# Patient Record
Sex: Male | Born: 1945 | Race: White | Hispanic: No | Smoking: Former smoker
Health system: Southern US, Community
[De-identification: ages and names within clinical notes are randomized; demographics above are authoritative.]

## PROBLEM LIST (undated history)

## (undated) DIAGNOSIS — I429 Cardiomyopathy, unspecified: Secondary | ICD-10-CM

## (undated) DIAGNOSIS — F1411 Cocaine abuse, in remission: Secondary | ICD-10-CM

## (undated) DIAGNOSIS — D649 Anemia, unspecified: Secondary | ICD-10-CM

## (undated) DIAGNOSIS — I509 Heart failure, unspecified: Secondary | ICD-10-CM

## (undated) DIAGNOSIS — E785 Hyperlipidemia, unspecified: Secondary | ICD-10-CM

## (undated) DIAGNOSIS — R0602 Shortness of breath: Secondary | ICD-10-CM

## (undated) DIAGNOSIS — F1011 Alcohol abuse, in remission: Secondary | ICD-10-CM

## (undated) DIAGNOSIS — M1990 Unspecified osteoarthritis, unspecified site: Secondary | ICD-10-CM

## (undated) DIAGNOSIS — I480 Paroxysmal atrial fibrillation: Secondary | ICD-10-CM

## (undated) DIAGNOSIS — I10 Essential (primary) hypertension: Secondary | ICD-10-CM

## (undated) HISTORY — PX: WISDOM TOOTH EXTRACTION: SHX21

## (undated) HISTORY — DX: Hyperlipidemia, unspecified: E78.5

## (undated) HISTORY — DX: Anemia, unspecified: D64.9

## (undated) HISTORY — PX: MASTOIDECTOMY: SHX711

## (undated) HISTORY — PX: UPPER GASTROINTESTINAL ENDOSCOPY: SHX188

## (undated) HISTORY — DX: Paroxysmal atrial fibrillation: I48.0

## (undated) HISTORY — DX: Cardiomyopathy, unspecified: I42.9

## (undated) HISTORY — PX: COLONOSCOPY: SHX174

## (undated) HISTORY — DX: Cocaine abuse, in remission: F14.11

## (undated) HISTORY — PX: HEMORROIDECTOMY: SUR656

## (undated) HISTORY — DX: Alcohol abuse, in remission: F10.11

---

## 2000-10-28 ENCOUNTER — Other Ambulatory Visit: Admission: RE | Admit: 2000-10-28 | Discharge: 2000-10-28 | Payer: Self-pay | Admitting: General Surgery

## 2001-03-03 ENCOUNTER — Other Ambulatory Visit: Admission: RE | Admit: 2001-03-03 | Discharge: 2001-03-03 | Payer: Self-pay | Admitting: General Surgery

## 2001-03-30 ENCOUNTER — Other Ambulatory Visit: Admission: RE | Admit: 2001-03-30 | Discharge: 2001-03-30 | Payer: Self-pay | Admitting: General Surgery

## 2001-04-14 ENCOUNTER — Other Ambulatory Visit: Admission: RE | Admit: 2001-04-14 | Discharge: 2001-04-14 | Payer: Self-pay | Admitting: General Surgery

## 2001-07-22 ENCOUNTER — Observation Stay (HOSPITAL_COMMUNITY): Admission: RE | Admit: 2001-07-22 | Discharge: 2001-07-23 | Payer: Self-pay | Admitting: General Surgery

## 2002-12-31 ENCOUNTER — Ambulatory Visit (HOSPITAL_COMMUNITY): Admission: RE | Admit: 2002-12-31 | Discharge: 2002-12-31 | Payer: Self-pay | Admitting: General Surgery

## 2002-12-31 ENCOUNTER — Encounter: Payer: Self-pay | Admitting: General Surgery

## 2006-12-22 ENCOUNTER — Inpatient Hospital Stay (HOSPITAL_COMMUNITY): Admission: EM | Admit: 2006-12-22 | Discharge: 2006-12-26 | Payer: Self-pay | Admitting: Emergency Medicine

## 2006-12-24 ENCOUNTER — Encounter: Payer: Self-pay | Admitting: Cardiovascular Disease

## 2007-01-19 ENCOUNTER — Ambulatory Visit (HOSPITAL_COMMUNITY): Admission: RE | Admit: 2007-01-19 | Discharge: 2007-01-19 | Payer: Self-pay | Admitting: Pulmonary Disease

## 2007-01-22 ENCOUNTER — Encounter (HOSPITAL_COMMUNITY): Admission: RE | Admit: 2007-01-22 | Discharge: 2007-02-21 | Payer: Self-pay | Admitting: *Deleted

## 2007-02-23 ENCOUNTER — Encounter (HOSPITAL_COMMUNITY): Admission: RE | Admit: 2007-02-23 | Discharge: 2007-03-25 | Payer: Self-pay | Admitting: *Deleted

## 2007-03-26 ENCOUNTER — Encounter (HOSPITAL_COMMUNITY): Admission: RE | Admit: 2007-03-26 | Discharge: 2007-04-07 | Payer: Self-pay | Admitting: *Deleted

## 2007-04-10 ENCOUNTER — Encounter (HOSPITAL_COMMUNITY): Admission: RE | Admit: 2007-04-10 | Discharge: 2007-05-10 | Payer: Self-pay | Admitting: *Deleted

## 2007-05-11 ENCOUNTER — Encounter (HOSPITAL_COMMUNITY): Admission: RE | Admit: 2007-05-11 | Discharge: 2007-06-10 | Payer: Self-pay | Admitting: *Deleted

## 2007-06-09 ENCOUNTER — Encounter (HOSPITAL_COMMUNITY): Admission: RE | Admit: 2007-06-09 | Discharge: 2007-07-08 | Payer: Self-pay | Admitting: *Deleted

## 2009-09-19 HISTORY — PX: US ECHOCARDIOGRAPHY: HXRAD669

## 2010-03-08 HISTORY — PX: TEE WITH CARDIOVERSION: SHX5442

## 2010-03-21 ENCOUNTER — Ambulatory Visit (HOSPITAL_COMMUNITY): Admission: RE | Admit: 2010-03-21 | Discharge: 2010-03-21 | Payer: Self-pay | Admitting: Cardiology

## 2010-03-27 ENCOUNTER — Encounter (INDEPENDENT_AMBULATORY_CARE_PROVIDER_SITE_OTHER): Payer: Self-pay | Admitting: Cardiology

## 2010-03-27 ENCOUNTER — Ambulatory Visit (HOSPITAL_COMMUNITY): Admission: RE | Admit: 2010-03-27 | Discharge: 2010-03-27 | Payer: Self-pay | Admitting: Cardiology

## 2010-04-20 ENCOUNTER — Ambulatory Visit (HOSPITAL_COMMUNITY): Admission: RE | Admit: 2010-04-20 | Discharge: 2010-04-20 | Payer: Self-pay | Admitting: Cardiology

## 2010-07-29 ENCOUNTER — Encounter: Payer: Self-pay | Admitting: General Surgery

## 2010-11-20 NOTE — Discharge Summary (Signed)
NAME:  Ronald Owens, Ronald Owens NO.:  [id number]   MEDICAL RECORD NO.:  [id number]          PATIENT TYPE:  INP   LOCATION:  3704                         FACILITY:  [facility]   PHYSICIAN:  Dani Gobble, MD       DATE OF BIRTH:  1945/08/22   DATE OF ADMISSION:  12/23/2006  DATE OF DISCHARGE:  12/26/2006                               DISCHARGE SUMMARY   DISCHARGE DIAGNOSES:  1. Nonischemic cardiomyopathy by catheterization this admission.  2. Congestive heart failure, improved at discharge.  3. Paroxysmal atrial fibrillation with pauses, sinus rhythm at      discharge.   HOSPITAL COURSE:  Ronald Owens is a 66 year old male who was transferred  from [facility] for evaluation of new onset CHF.  He also had  atrial fibrillation and atrial flutter up there with pauses.  Prior to  admission he had been on no medications.  He has a history of  hypertension.  He has a prior history of drug and alcohol use, but none  in ten years.  He says he quit smoking two weeks ago.  The patient was  transferred and seen by Dr. Elsie Lincoln on admission.  By the time he got  here he had converted to sinus rhythm.  Diagnostic catheterization was  done on 12/24/2006 which revealed normal coronaries and dilated  cardiomyopathy with EF of 20 to 25%.  He has remained in sinus rhythm.  We feel he can be discharged on December 26, 2006.   DISCHARGE MEDICATIONS:  1. Lisinopril 20 mg daily.  2. Aldactone 12.5 mg daily.  3. Lanoxin 0.25 mg daily.  4. Coreg 12.5 mg b.i.d.  5. Two baby aspirin daily.   He will follow up with Dr. Domingo Sep in [place].   LABS AND X-RAYS:  Sodium 142, potassium 4.9, BUN 13, creatinine 1.12,  white count 7.7, hemoglobin 14.2, hematocrit 41.8, platelets 182.  Liver  functions are normal.  CKMB and troponins are negative.  LDL 114, HDL  41.  Blood cultures are negative.  Sputum culture shows gram-positive  cocci.  BNP is 180 at discharge.  Hemoglobin A1c 5.6.  D-dimer 0.95.  TSH  1.99.  Echocardiogram shows an EF of 35 to 40%.  CT angiogram of the  chest showed no evidence of pulmonary emboli and diffuse emphysema with  nonspecific right middle lobe nodule.  Follow-up CT is recommended in  six months.  INR is 1.0.  EKG shows sinus rhythm.   DISPOSITION:  The patient is discharged in stable condition.  He will  need to follow up with Dr. Domingo Sep.  We did cut back on his Aldactone  at discharge because of trend towards hyperkalemia.  Will go ahead and  give him a course of antibiotics at discharge for bronchitis.  He will  need a primary in [place].      Abelino Derrick, P.A.    ______________________________  Dani Gobble, MD    LKK/MEDQ  D:  12/26/2006  T:  12/26/2006  Job:  [id number]

## 2010-11-20 NOTE — Group Therapy Note (Signed)
NAME:  Ronald Owens, Ronald Owens NO.:  [id number]   MEDICAL RECORD NO.:  [id number]          PATIENT TYPE:  INP   LOCATION:  IC06                          FACILITY:  [facility]   PHYSICIAN:  Dorris Singh, DO    DATE OF BIRTH:  07-11-1945   DATE OF PROCEDURE:  DATE OF DISCHARGE:                                 PROGRESS NOTE   PROGRESS NOTE   This is patient's ICU day #1.  Patient apparently had a bad dream last  night and awoke in A-Fib with increased shortness or breath.  After, the  night doctor was called and evaluated him.  After several attempts to  get his heart rate and breathing under control, patient was then  transferred to the ICU for more monitoring.  Since that time patient has  said he has felt very calm and has been able to sleep and get rest and  feels very comfortable.  Has no complaints currently at this time.  Patient is being followed by [facility] who are monitoring  his A-Fib, as well as his congestive heart failure.   VITAL SIGNS:  Temperature 98.2, pulse 72, respirations 18, blood  pressure 114/77.  GENERAL:  This is a well-developed, well-nourished male who is in no  acute distress, who is pleasant and jovial to speak with.  HEENT:  Eyes are equal and reactive to light.  HEART:  Regular rate and rhythm.  LUNGS:  Positive basal rales at the bases.  Mild wheezing mid-base.  ABDOMEN:  Round, soft, nontender, nondistended.  Bowel sounds x 4 in all  4 quadrants.  EXTREMITIES:  Ted hose are present with mild +2 pitting edema.  No  cyanosis or ecchymosis noted.   Current labs for patient include an ABG done with pH of 7.43, PO2 43.3,  PCO2 55.1, bicarbonate 28.8.  Also a CBC was done with a white count of  8.9, hemoglobin 14.9, hematocrit 42.9, platelets 187.  Blood chemistry  with sodium 142, potassium 3.8, chloride 105, CO2 29, glucose 119, BUN  10, creatinine 1.05.  His BMP was 185.   PLAN:  1. Congestive heart failure exacerbation.   Will continue with current      therapy and continue to monitor patient, monitoring diuretics and      I's and O's.  2. Atrial fib.  Patient is currently on anti-coagulation therapy, as      well as Cardizem.  Will continue to follow closely along with      [facility].      Dorris Singh, DO  Electronically Signed     CB/MEDQ  D:  12/23/2006  T:  12/24/2006  Job:  [id number]

## 2010-11-20 NOTE — Procedures (Signed)
NAME:  Ronald Owens, Ronald Owens                  ACCOUNT NO.:  [id number]   MEDICAL RECORD NO.:  [id number]          PATIENT TYPE:  OUT   LOCATION:  RAD                           FACILITY:  [facility]   PHYSICIAN:  Edward L. Juanetta Gosling, M.D.DATE OF BIRTH:  December 12, 1945   DATE OF PROCEDURE:  DATE OF DISCHARGE:                            PULMONARY FUNCTION TEST   1. Spirometry shows a severe ventilatory defect, with evidence of      airflow obstruction.  2. Lung volumes show air trapping.  3. DLCO is mildly reduced.  4. There is no significant bronchodilator effect.      Edward L. Juanetta Gosling, M.D.  Electronically Signed     ELH/MEDQ  D:  01/21/2007  T:  01/22/2007  Job:  [id number]

## 2010-11-20 NOTE — Procedures (Signed)
NAME:  Ronald Owens, Ronald Owens                  ACCOUNT NO.:  [id number]   MEDICAL RECORD NO.:  [id number]          PATIENT TYPE:  INP   LOCATION:  A207                          FACILITY:  [facility]   PHYSICIAN:  Richard A. Alanda Amass, M.D.DATE OF BIRTH:  10-28-1945   DATE OF PROCEDURE:  DATE OF DISCHARGE:                                ECHOCARDIOGRAM   A 2-D echocardiogram was done in this 65 year old white gentleman with  new-onset atrial flutter, rapid ventricular response.  Prior history of  mild LV dysfunction with EF approximately 45% in 2006, associated  symptoms of chest pain prompting admission, with known hyperlipidemia  and systemic hypertension and past cigarette abuse.   Echocardiogram was technically poor.  The patient was in atrial flutter  with variable rate throughout the procedure, rate approximately 90-110.   The aorta was normal at 3.7 cm.   The aortic valve was not well visualized on Doppler interrogation.  There was no aortic stenosis demonstrated and no AI on color flow  mapping.  There appeared to be good aortic valve opening.   The left atrium was enlarged at 5 cm.  No clots were seen.  The patient  was in atrial flutter during the procedure.   IVS and LVW were concentrically thickened to 1.5-1.6 cm each compatible  with moderately severe concentric hypertrophy.   Mitral valve showed mild annular calcification with mild mitral  regurgitation.   There was mild to moderate tricuspid regurgitation present.   Right ventricle was mildly dilated and mildly hypokinetic.   Left ventricular internal dimensions were WNL with LVIDD equal 4.1,  LVISD equal 3.7 cm.  There was generalized global hypokinesis.  There  was inferior wall motion abnormality of hypo- to akinesis present on  limited views.  Estimated EF was approximately 35-40% with the patient  in atrial flutter.   There was no pericardial effusion.  There was mild abnormal septal  contraction possibly related to the  patient's IVCD.   The 2-D echocardiogram demonstrates atrial flutter with increased  ventricular response.  There is moderate global hypokinesis and inferior  wall motion abnormality along with left atrial enlargement.  No  significant valvular  abnormality present and estimated EF approximately 35-40%.  This may be  important due to the patient's atrial flutter, but suggests the  possibility of coronary disease with wall motion abnormality and  concentric hypertrophy compatible with his history of and compatible in  part with his history of hypertension.      Richard A. Alanda Amass, M.D.  Electronically Signed     RAW/MEDQ  D:  12/22/2006  T:  12/22/2006  Job:  [id number]   cc:   Dani Gobble, MD  Fax: [phone]   Dr. Scarlett Presto

## 2010-11-20 NOTE — Discharge Summary (Signed)
NAME:  Ronald Owens, Ronald Owens NO.:  [id number]   MEDICAL RECORD NO.:  [id number]          PATIENT TYPE:  INP   LOCATION:  3704                         FACILITY:  [facility]   PHYSICIAN:  Abelino Derrick, P.A.   DATE OF BIRTH:  [birth year]   DATE OF ADMISSION:  12/23/2006  DATE OF DISCHARGE:  12/26/2006                               DISCHARGE SUMMARY   ADDENDUM:  [name] was sent home on Coumadin therapy.  He will have a  Protime checked next week.  He was sent home on Coumadin 2.5 mg today.  He will also need Lovenox crossover.      Abelino Derrick, P.Memory Dance  D:  12/26/2006  T:  12/26/2006  Job:  [id number]

## 2010-11-20 NOTE — Discharge Summary (Signed)
NAME:  FREEMAN, BORBA NO.:  [id number]   MEDICAL RECORD NO.:  [id number]          PATIENT TYPE:  INP   LOCATION:  3704                         FACILITY:  [facility]   PHYSICIAN:  Madaline Savage, M.D.DATE OF BIRTH:  [birth year]   DATE OF ADMISSION:  12/23/2006  DATE OF DISCHARGE:  12/26/2006                               DISCHARGE SUMMARY   ADDENDUM NUMBER TWO:  After discussion with Dr. Allyson Sabal, it was decided  not to send Mr. Foushee home on Lovenox.  He has been in sinus rhythm.  He  is a self pay patient and would not be able to afford this.  We also did  not send him home on an antibiotic as he was just put on Coumadin.  He  is not febrile.  His white count is normal.  He will have a pro time on  9 a.m. Monday.  His INR goal will be 2-3 for atrial fibrillation.      Abelino Derrick, P.A.    ______________________________  Madaline Savage, M.D.    Lenard Lance  D:  12/26/2006  T:  12/26/2006  Job:  [id number]

## 2010-11-20 NOTE — H&P (Signed)
NAME:  Ronald Owens, Ronald Owens NO.:  [id number]   MEDICAL RECORD NO.:  [id number]          PATIENT TYPE:  INP   LOCATION:  A207                          FACILITY:  [facility]   PHYSICIAN:  Dorris Singh, DO    DATE OF BIRTH:  07/12/1945   DATE OF ADMISSION:  12/22/2006  DATE OF DISCHARGE:  LH                              HISTORY & PHYSICAL   The patient is a 65 year old Caucasian male who presented to [facility]  emergency room complaining of shortness of breath.  The patient had said  that he had noticed swelling in his legs for about the last week and had  experienced shortness of breath particularly while sleeping since  Friday.  He said that it had started a couple of days ago, and it was  acute and he had had multiple episodes.  Noticed that it continued to  worsen.  He said that he was at a meeting the night before and a friend  of his saw his condition and recommended that he be seen in the  emergency room.   PAST MEDICAL HISTORY:  Regular childhood diseases.   PAST SURGICAL HISTORY:  The patient denies any surgeries.   FAMILY HISTORY:  History of coronary artery disease.   PAST MEDICAL HISTORY:  The patient states none.   SOCIAL HISTORY:  The patient used to smoke over 10 years ago.  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   ALLERGIES:  The patient denies any drug allergies at this point in time.   REVIEW OF SYSTEMS:  The only pertinent positives are positive change in  weight, positive fatigue, positive swelling in legs,  positive shortness  of breath, wheezing, and cough.   PHYSICAL EXAMINATION:  VITAL SIGNS:  Current vitals on the floor:  Temperature 98.9, pulse 149, respirations 20, blood pressure 132/80.  GENERAL:  This is a well-developed, well-nourished pleasant  Caucasian  male who looks his stated age.  SKIN:  Has good turgor, good texture.  Hair is normal consistency.  No  nail pitting or stippling.  HEENT:  Head normal, atraumatic, and symmetric.  Eyes are equal and  reactive to light and are symmetrical.  Ears symmetrical.  No tenderness  or discharge noted.  Nose symmetrical, no tenderness or discharge noted.  Mouth and throat:  Fair hygiene, no dentures noted.  No erythema or  exudate.  NECK:  No masses, full range of motion.  Trachea is midline.  Thyroid is  within normal limits.  HEART:  Regular rate and rhythm.  LUNGS:  Chest is symmetrical with respirations, positive wheezes, and  crackles particularly at the lower bases.  ABDOMEN:  Soft, nontender, nondistended.  Bowel sounds in all four  quadrants.  BACK:  No CVA tenderness.  MUSCULOSKELETAL:  No muscular atrophy weakness, 5/5 in all extremities.  Positive swelling of  the lower extremities up to +4 pitting edema.  VASCULAR:  No JVD noted.  NEUROLOGIC:  Cranial nerves II-XII grossly intact.   LABORATORY DATA:  Labs that were done in the emergency room include CBC  with WBC 8.4, hemoglobin 16, hematocrit 45, platelets 192.  CMP:  Sodium  144, potassium 3.7, chloride 108, CO2 26, glucose 113, BUN 10,  creatinine 0.81.  The patient had an elevation in CK-MB 2.7, troponin  0.05, myoglobin 79.  BNP 242.   ASSESSMENT/PLAN:  This is a 65 year old Caucasian male who presented to  the emergency room with new onset of shortness of breath.  While he was  in the emergency room too it was noted that he went into bouts of atrial  fibrillation.  At that point in time he was started on Cardizem drip  which controlled his rate.   1. Atrial fibrillation.  2. New onset congestive heart failure.   PLAN:  The patient will be admitted to 2A telemetry.  [facility] was consulted.  We will do cardiac enzymes x6.  Also do a.m.  labs, BNP, CP, CBC, and CMP.  Will have an echocardiogram  ordered for  patient.  Will start patient on Lasix IV daily.  Put patient on oxygen  and give him handheld nebulizers.  Will continue patient on Cardizem  drip and await any further recommendations that [facility] may  have for him, and will continue to monitor.   Please cc a copy to Dr. Timmie Foerster, DO  Electronically Signed     CB/MEDQ  D:  12/22/2006  T:  12/23/2006  Job:  [id number]

## 2010-11-20 NOTE — Cardiovascular Report (Signed)
NAME:  Ronald Owens, Ronald Owens NO.:  [id number]   MEDICAL RECORD NO.:  [id number]          PATIENT TYPE:  INP   LOCATION:  2912                         FACILITY:  [facility]   PHYSICIAN:  Madaline Savage, M.D.DATE OF BIRTH:  March 31, 1946   DATE OF PROCEDURE:  12/24/2006  DATE OF DISCHARGE:                            CARDIAC CATHETERIZATION   PROCEDURES PERFORMED:  1. Selective coronary angiography by Judkins technique.  2. Retrograde left heart catheterization.  3. Left ventricular angiography.  4. Thermodilution and Fick cardiac output determinations.  5. Right heart catheterization.   COMPLICATIONS:  None.   ENTRY SITE:  Right femoral artery and vein.   DYE USED:  Omnipaque.   MEDICATIONS GIVEN:  Versed 1 mg, fentanyl 50, giving a nice level of  sedation.   PATIENT PROFILE:  Ronald Owens is a very pleasant gentleman, who is 65 years  old, who presented to [facility] recently with signs and  symptoms of heart failure, atrial flutter with fast ventricular  response.  He was hospitalized on the encompass service at [facility].  Seen by Dr. Alanda Amass initially for cardiac consultation who  obtained an echocardiogram with ejection fraction being estimated at  30%. Dr. Alanda Amass initiated multiple agents to try to improve  ventricular response to atrial fib.  He started the patient on some  propafenone.  When I saw the patient yesterday, he had been moved to the  intensive care unit during the night because of two episodes of pausing  greater than 5 seconds.  He had only P waves and no QRS complexes!  At  that point in time, when I saw him, I felt that he needed to be  transferred to [facility] for further cardiac evaluation to include a cardiac  cath and a potential pacemaker depending on the cath results.  He was  transported from [facility] yesterday and arrived here at [facility]  shortly after suppertime on December 23, 2006.  The patient has been stable  overnight.  His cardiac enzymes are negative and he enters the cath lab  electively for diagnostic right and left heart catheterization and  coronary angiography.  Those procedures were performed electively and  without complication and results are described below.   PRESSURES:  Right atrial pressure was 15/12 with a mean of 12.  The  right ventricular pressure was 38/10, end-diastolic pressure 13.  Pulmonary artery pressure 34/19, mean of 26.  Pulmonary capillary wedge:  Mean of 26, A wave 31, V wave 32, central aortic pressure 145/90, mean  of 115, left ventricular pressure 150/8, end-diastolic pressure 12.  Fick cardiac output 5.  Fick cardiac index 2.4.  Thermodilution cardiac  output 5.4.  Thermodilution cardiac index 2.6.  Systemic vascular  resistance by Fick was 1664, by thermal 1540.   ANGIOGRAPHIC RESULTS:  The patient's coronary arteries were  angiographically patent.  The left main coronary artery was very short.  It was large in caliber.  The LAD course of the cardiac apex giving rise  to one major diagonal branch arising very proximally and that diagonal  branch trifurcated.  There was a second, very distal diagonal which was  small.  No lesions were seen in the LAD, the large diagonal, or the  smaller diagonal.   Circumflex coronary artery was nondominant giving rise to one obtuse  marginal branch, 2 atrial circumflex branches, and a posterolateral  branch distally.  No lesions were seen.   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  There was  an acute marginal branch arising very proximally in the vessel.  There  was tortuosity throughout this vessel.  This vessel appeared to be 5 mm  in diameter.  There was a large posterior descending and posterolateral  branch, both of these were normal.   Left ventricular angiogram showed diffuse hypokinesis of all wall  segments globally.  Ejection fraction was estimated to be 20-25%.  There  was  mild-to-moderate mitral regurgitation.  I did not see any evidence  of LV thrombus.   FINAL IMPRESSION:  1. Dilated cardiomyopathy with ejection fraction 20-25%.  2. Well-preserved cardiac outputs averaging about 2.5 liters per      minute and left ventricular end-diastolic pressure only slightly      elevated at 19.  3. Angiographically patent coronary arteries with a right coronary      artery dominant system.  4. Pulmonary artery pressures showing mild elevation of pressure and      high wedge pressure.  5. Recent episodes of rapid atrial flutter with variable block and, at      times, absence of ventricular conduction for 5.2 seconds.  6. Systemic hypertension.   PLAN:  The patient will need weaning of IV nitro.  He needs to be  started on digoxin, continuation of his ACE inhibitor with up-titration  almost daily, initiation of Coreg for his LV failure.  If the patient  remains in sinus rhythm, no pacemaker will be required.  He will need  close follow-up as an outpatient.  I believe the patient will be very  compliant.           ______________________________  Madaline Savage, M.D.     WHG/MEDQ  D:  12/24/2006  T:  12/24/2006  Job:  [id number]   cc:   Hewitt Blade, Comal Margo Aye, MD  [facility]

## 2010-11-23 NOTE — Op Note (Signed)
[facility]  Patient:    Ronald Owens, Ronald Owens Visit Number: [id number] MRN: [id number]          Service Type: DSU Location: 3A A306 01 Attending Physician:  Barbaraann Barthel Dictated by:   Barbaraann Barthel, M.D. Proc. Date: 07/22/01 Admit Date:  07/22/2001                             Operative Report  PREOPERATIVE DIAGNOSIS:  Umbilical hernia.  POSTOPERATIVE DIAGNOSIS:  Umbilical hernia.  PROCEDURE:  Umbilical herniorrhaphy.  INDICATIONS:  This is a 65 year old white male who had a mass in his umbilicus, consistent clinically with an umbilical hernia and some complaints of abdominal pain.  We had planned for an elective repair.  We discussed in detail the complications of the procedure including, but not limited to bleeding, infection, and recurrence.  Informed consent was obtained.  GROSS OPERATIVE FINDINGS:  A small umbilical hernia about the size of a dime with herniated fat in the umbilical hernia sac.  No mesh prosthesis repair was necessary.  No other abnormalities were encountered.  PROCEDURE:  The patient was placed in the supine position after the adequate administration of general anesthesia by LMA anesthesia.  The patients abdomen was prepped with Betadine solution and draped in the usual manner. A periumbilical incision was carried out over the superior aspect of the umbilicus.  The skin and subcutaneous tissue were dissected.  The umbilical hernia was dissected free, and the hernia was opened.  There was some incarcerated fat only.  There were no other abnormalities encountered.  this was ligated and divided.  The fascial rim was then repaired using figure-of-eight 0 monofilament nonabsorbable suture.  The umbilicus was then tacked down to the fascia, returning it to its anatomic concave appearance. The wound was irrigated, and the skin was approximated with subcuticular 5-0 Polysorb sutures.  Steri-Strips and Neosporin were used for  dressing, and prior to closure, all sponge, needle, and instrument counts were found to be correct.  The estimated blood loss was minimal.  The patient received 1 liter of crystalloids intraoperatively. No drains were placed. There were no complications. Dictated by:   Barbaraann Barthel, M.D. Attending Physician:  Barbaraann Barthel DD:  07/22/01 TD:  07/22/01 Job: [id number]

## 2011-04-24 LAB — POCT CARDIAC MARKERS
CKMB, poc: 2.7
CKMB, poc: 2.8
CKMB, poc: 2.9
Myoglobin, poc: 79.7
Operator id: 132501
Troponin i, poc: <0.05
Troponin i, poc: <0.05

## 2011-04-24 LAB — COMPREHENSIVE METABOLIC PANEL
ALT: 15
ALT: 21
ALT: 16
AST: 16
AST: 11
Albumin: 2.6 — ABNORMAL LOW
Alkaline Phosphatase: 98
Alkaline Phosphatase: 83
BUN: 10
CO2: 26
CO2: 32
CO2: 34 — ABNORMAL HIGH
Calcium: 8.7
Calcium: 8.5
Chloride: 108
Chloride: 100
Creatinine, Ser: 0.99
GFR calc Af Amer: >60
GFR calc Af Amer: >60
GFR calc non Af Amer: >60
GFR calc non Af Amer: >60
GFR calc non Af Amer: >60
Glucose, Bld: 113 — ABNORMAL HIGH
Glucose, Bld: 91
Potassium: 3.7
Potassium: 3.8
Potassium: 4.9
Sodium: 142
Sodium: 144
Sodium: 142
Total Bilirubin: 0.5
Total Protein: 5.5 — ABNORMAL LOW
Total Protein: 6.2
Total Protein: 5 — ABNORMAL LOW

## 2011-04-24 LAB — POCT I-STAT 3, VENOUS BLOOD GAS (G3P V)
TCO2: 30
pH, Ven: 7.367 — ABNORMAL HIGH

## 2011-04-24 LAB — B-NATRIURETIC PEPTIDE (CONVERTED LAB)
Pro B Natriuretic peptide (BNP): 185 — ABNORMAL HIGH
Pro B Natriuretic peptide (BNP): 180 — ABNORMAL HIGH
Pro B Natriuretic peptide (BNP): 318 — ABNORMAL HIGH

## 2011-04-24 LAB — CULTURE, BLOOD (ROUTINE X 2)
Culture: NO GROWTH
Report Status: 6222008

## 2011-04-24 LAB — PROTIME-INR
INR: 1
INR: 1.1
Prothrombin Time: 13.6
Prothrombin Time: 14.1

## 2011-04-24 LAB — DIFFERENTIAL
Basophils Absolute: 0
Basophils Absolute: 0
Basophils Relative: 1
Eosinophils Absolute: 0.2
Eosinophils Relative: 1
Lymphocytes Relative: 16
Monocytes Relative: 8
Neutro Abs: 5.3
Neutro Abs: 6.7
Neutrophils Relative %: 63
Neutrophils Relative %: 75

## 2011-04-24 LAB — BLOOD GAS, ARTERIAL
Acid-Base Excess: 4.8 — ABNORMAL HIGH
O2 Content: 3
pCO2 arterial: 43.3

## 2011-04-24 LAB — CARDIAC PANEL(CRET KIN+CKTOT+MB+TROPI)
Relative Index: INVALID
Relative Index: INVALID
Total CK: 57

## 2011-04-24 LAB — CBC
HCT: 45.6
Hemoglobin: 16
Hemoglobin: 14.2
Platelets: 187
Platelets: 178
RBC: 4.47
RBC: 4.01 — ABNORMAL LOW
RBC: 4.1 — ABNORMAL LOW
RDW: 13.9
RDW: 13.9
WBC: 6.9

## 2011-04-24 LAB — TROPONIN I
Troponin I: 0.06
Troponin I: 0.03

## 2011-04-24 LAB — EXPECTORATED SPUTUM ASSESSMENT W GRAM STAIN, RFLX TO RESP C

## 2011-04-24 LAB — CK TOTAL AND CKMB (NOT AT ARMC)
CK, MB: 1.6
CK, MB: 1.9
Relative Index: INVALID
Relative Index: INVALID
Total CK: 37
Total CK: 45

## 2011-04-24 LAB — BASIC METABOLIC PANEL
CO2: 31
Calcium: 8.3 — ABNORMAL LOW
GFR calc Af Amer: >60
GFR calc non Af Amer: >60
Sodium: 137

## 2011-04-24 LAB — CULTURE, RESPIRATORY W GRAM STAIN

## 2011-04-24 LAB — LIPID PANEL
HDL: 41
Total CHOL/HDL Ratio: 4.2
Triglycerides: 86
VLDL: 17

## 2011-04-24 LAB — APTT: aPTT: 32

## 2011-04-24 LAB — POCT I-STAT 3, ART BLOOD GAS (G3+)
Acid-Base Excess: 3 — ABNORMAL HIGH
O2 Saturation: 91
pCO2 arterial: 43.6

## 2011-04-24 LAB — TSH: TSH: 1.999

## 2012-09-15 ENCOUNTER — Encounter (INDEPENDENT_AMBULATORY_CARE_PROVIDER_SITE_OTHER): Payer: Self-pay | Admitting: Internal Medicine

## 2012-09-15 ENCOUNTER — Ambulatory Visit (INDEPENDENT_AMBULATORY_CARE_PROVIDER_SITE_OTHER): Payer: Medicare Other | Admitting: Internal Medicine

## 2012-09-15 VITALS — BP 130/74 | HR 78 | Temp 97.7°F | Resp 18 | Ht 74.0 in | Wt 188.9 lb

## 2012-09-15 DIAGNOSIS — I428 Other cardiomyopathies: Secondary | ICD-10-CM

## 2012-09-15 NOTE — Progress Notes (Addendum)
CONSULTING PHYSICIAN: Barbaraann Barthel, M.D.  REASON FOR CONSULTATION: New onset of ascites and scrotal edema.  HISTORY OF PRESENT ILLNESS: The patient is a 67 year old Caucasian male  who has history of nonischemic cardiomyopathy and stopped all of his  medications about 2 years ago. He was in usual state of health until  about 4 weeks ago when he noted a gradual abdominal distention, and  about 2 weeks ago, he noted scrotal edema. He got concerned on the  morning of September 08, 2012 ,when he noted his penis was imbedded. He did  not have any difficulty voiding though. He was seen by Dr. Malvin Johns,  who felt he had ascites and scrotal edema. He did confirm me over the  phone. The patient was begun on diuretic therapy. He had lab studies  as below and this visit was arranged.  The patient feels better, after 20 pounds that he had gained for the  last few weeks. He has lost pain. He does complain of shortness of  breath with minimal exertion, but he denies chest pain. His abdominal  distention has decreased, but he is not back to normal. He denies  weakness or postal symptoms. He has good appetite. He denies nausea,  vomiting, heartburn, melena, or rectal bleeding. He also denies dysuria  or hematuria. There is no history of ascites in the past. There is  also no history of liver disease. He used to drink alcohol in excessive  amounts, but quit 15 years ago.  CURRENT MEDICATIONS: Aspirin 325 mg p.o. daily, furosemide 40 mg p.o.  q.a.m., spironolactone 50 mg p.o. b.i.d., thiamine 100 mg p.o. daily,  Centrum Silver adult 50 one p.o. daily.  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  He regularly goes to narcotic anonymous. He never used IV drugs though.  History of nonischemic cardiomyopathy. 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 He had normal  coronaries with a right coronary artery dominant system.  History of  atrial flutter or fibrillation and he was on warfarin for a  while. Hemorrhoidectomy in 1979 and redo in 1984. He had umbilical  hernia repair in January 2003. He also has had left mastectomy. For  painful gynecomastia years ago. 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 His last colonoscopy was over 20 years  ago.  ALLERGIES: None known.  FAMILY HISTORY: Father died at age 66 secondary to MI and mother died  within 43 months of diagnosis of pancreatic carcinoma at age 93.  He has 2 brothers, ages 83 and 9 in good health, and 2 sisters ages 29  and 58 in good health.  SOCIAL HISTORY: He is divorced. He does not have any children. He  graduated from [employer] in 1968. He is presently retired.  He is now retired. He smokes cigarettes 2 packs a day for 35 years but  quit 5 years ago. He had consumed alcohol in excessive amounts for over  30 years but quit 15 years ago. He does not recall that he ever had  alcoholic hepatitis or pancreatitis while he was drinking.  PHYSICAL EXAMINATION:  VITAL SIGNS: Weight 188.9 pounds. He is 74  inches tall, pulse 118 per minute and irregular. Blood pressure 130/74,  respirations 18, and temp is 97.7.  HEENT: Conjunctivae is pink. Sclera is nonicteric. Oropharyngeal  mucosa is normal. He has partial upper and  lower dentures. No neck  masses or thyromegaly noted. JVD appears to be elevated. He has muscle  atrophy around the shoulder joint and pectoral areas.  CHEST: Scar below her left nipple. CARDIAC: Regular rhythm. Normal S1 and S2. No murmur noted.  LUNGS: Auscultation of lungs reveal a few rhonchi and rales at both  bases.   ABDOMEN: Full with edema to the lower abdominal edema to abdominal wall  below the umbilicus. Abdomen is somewhat tense but nontender. Unable  to palpate her below with liver or spleen. The scrotal edema noted. It  is not tense.  EXTREMITIES: He has 3 to + pitting edema involving his  legs. He has  trace edema involving his thighs. No clubbing noted.  LABORATORY DATA: Lab data from September 08, 2012, WBC 8.3, H and H is 18.2  and 53.7, MCV 108.9, platelet count is 145 K. INR is 1.11. Sodium 147,  potassium 4.8, chloride 100, CO2 34, glucose 124, BUN 13, creatinine  1.10, bilirubin 1.1, AP 85, AST 17, ALT 12, total protein 5.9 with  albumin of 3.9, calcium is 9.2, AFP is 2.5.  ASSESSMENT: The patient is a 67 year old Caucasian male with history of  dilated nonischemic cardiomyopathy, who stopped his medications over 2  years ago, now presents with ascites and scrotal edema. His ascites and  scrotal edema has improved with diuretic therapy. His transaminases and  serum albumin are normal. There is no history of chronic liver disease  that we know of. He does not have other stigmata of portal  hypertension.  I suspect that his ascites and scrotal edema as well as lower extremity  edema, may be related to cardiomyopathy and right-sided failure. He is  tachycardic and he possibly has SVT.  The patient needs to be hospitalized.  RECOMMENDATIONS: The patient was advised hospitalization which she  declined. I did call Dr. Clayborne Dana office and they were kind  enough to try to work him in on September 16, 2012.  The patient is agreeable to go to emergency room should he develop chest  pain or postal symptoms.  We will schedule him for hepatobiliary ultrasound and he also needs to  have a repeat metabolic 7 to make sure he does not develop hyperkalemia  with spironolactone.  He will also get a chest x-ray, film.  I will plan to see him back in about a month.  We appreciate the opportunity to participate in the care of this  gentleman.

## 2012-09-15 NOTE — Patient Instructions (Signed)
Physician will contact you with results of ultrasound and blood work. Appointment with Dr. York Ram or associates in near future.

## 2012-09-16 NOTE — Consult Note (Signed)
NAME:  Ronald Owens, Ronald Owens                       ACCOUNT NO.:  MEDICAL RECORD NO.:  [id number]  LOCATION:                                 FACILITY:  PHYSICIAN:  Lionel December, M.D.    DATE OF BIRTH:  Dec 24, 1945  DATE OF CONSULTATION:  09/15/2012 DATE OF DISCHARGE:                                CONSULTATION   CONSULTING PHYSICIAN:  Barbaraann Barthel, M.D.  REASON FOR CONSULTATION:  New onset of ascites and scrotal edema.  HISTORY OF PRESENT ILLNESS:  The patient is a 67 year old Caucasian male who has history of nonischemic cardiomyopathy and stopped all of his medications about 2 years ago.  He was in usual state of health until about 4 weeks ago when he noted a gradual abdominal distention, and about 2 weeks ago, he noted scrotal edema.  He got concerned on the morning of September 08, 2012 ,when he noted his penis was imbedded.  He did not have any difficulty voiding though.  He was seen by Dr. Malvin Johns, who felt he had ascites and scrotal edema.  He did confirm me over the phone.  The patient was begun on diuretic therapy.  He had lab studies as below and this visit was arranged.  The patient feels better, after 20 pounds that he had gained for the last few weeks.  He has lost pain.  He does complain of shortness of breath with minimal exertion, but he denies chest pain.  His abdominal distention has decreased, but he is not back to normal.  He denies weakness or postal symptoms.  He has good appetite.  He denies nausea, vomiting, heartburn, melena, or rectal bleeding.  He also denies dysuria or hematuria.  There is no history of ascites in the past.  There is also no history of liver disease.  He used to drink alcohol in excessive amounts, but quit 15 years ago.  CURRENT MEDICATIONS:  Aspirin 325 mg p.o. daily, furosemide 40 mg p.o. q.a.m., spironolactone 50 mg p.o. b.i.d., thiamine 100 mg p.o. daily, Centrum Silver adult 50 one p.o. daily.  PAST MEDICAL HISTORY:  History of drug  addiction and he is in remission. He regularly goes to narcotic anonymous.  He never used IV drugs though.  History of nonischemic cardiomyopathy.  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  He had normal coronaries with a right coronary artery dominant system.  History of atrial flutter or fibrillation and he was on warfarin for a while.  Hemorrhoidectomy in 1979 and redo in 1984.  He had umbilical hernia repair in January 2003.  He also has had left mastectomy.  For painful gynecomastia years ago.  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  His last colonoscopy was over 20 years ago.  ALLERGIES:  None known.  FAMILY HISTORY:  Father died at age 69 secondary to MI and mother died within 85 months of diagnosis of pancreatic carcinoma at age 32.  He has 2 brothers, ages 57 and 21 in good health, and 2 sisters ages 63 and 38 in good  health.  SOCIAL HISTORY:  He is divorced.  He does not have any children.  He graduated from [employer] in 1968.  He is presently retired. He is now retired.  He smokes cigarettes 2 packs a day for 35 years but quit 5 years ago.  He had consumed alcohol in excessive amounts for over 30 years but quit 15 years ago.  He does not recall that he ever had alcoholic hepatitis or pancreatitis while he was drinking.  PHYSICAL EXAMINATION:  VITAL SIGNS:  Weight 188.9 pounds.  He is 74 inches tall, pulse 118 per minute and irregular.  Blood pressure 130/74, respirations 18, and temp is 97.7. HEENT:  Conjunctivae is pink.  Sclera is nonicteric.  Oropharyngeal mucosa is normal.  He has partial upper and lower dentures.  No neck masses or thyromegaly noted.  JVD appears to be elevated.  He has muscle atrophy around the shoulder joint and pectoral areas. CARDIAC:  Regular rhythm.  Normal S1 and S2.  No murmur noted. LUNGS:  Auscultation of lungs reveal a few rhonchi and rales at  both bases. ABDOMEN:  Full with edema to the lower abdominal edema to abdominal wall below the umbilicus.  Abdomen is somewhat tense but nontender.  Unable to palpate her below with liver or spleen.  The scrotal edema noted.  It is not tense. EXTREMITIES:  He has 3 to + pitting edema involving his legs.  He has trace edema involving his thighs.  No clubbing noted.  LABORATORY DATA:  Lab data from September 08, 2012, WBC 8.3, H and H is 18.2 and 53.7, MCV 108.9, platelet count is 145 K.  INR is 1.11.  Sodium 147, potassium 4.8, chloride 100, CO2 34, glucose 124, BUN 13, creatinine 1.10, bilirubin 1.1, AP 85, AST 17, ALT 12, total protein 5.9 with albumin of 3.9, calcium is 9.2, AFP is 2.5.  ASSESSMENT:  The patient is a 67 year old Caucasian male with history of dilated nonischemic cardiomyopathy, who stopped his medications over 2 years ago, now presents with ascites and scrotal edema.  His ascites and scrotal edema has improved with diuretic therapy.  His transaminases and serum albumin are normal.  There is no history of chronic liver disease that we know of.  He does not have other stigmata of portal hypertension.  I suspect that his ascites and scrotal edema as well as lower extremity edema, may be related to cardiomyopathy and right-sided failure.  He is tachycardic and he possibly has SVT.  The patient needs to be hospitalized.  RECOMMENDATIONS:  The patient was advised hospitalization which she declined.  I did call Dr. Clayborne Dana office and they were kind enough to try to work him in on September 16, 2012.  The patient is agreeable to go to emergency room should he develop chest pain or postal symptoms.  We will schedule him for hepatobiliary ultrasound and he also needs to have a repeat metabolic 7 to make sure he does not develop hyperkalemia with spironolactone.  He will also get a chest x-ray, film.  I will plan to see him back in about a month.  We appreciate the  opportunity to participate in the care of this gentleman.          ______________________________ Lionel December, M.D.     NR/MEDQ  D:  09/15/2012  T:  09/16/2012  Job:  [id number]  cc:   Barbaraann Barthel, M.D. Fax: [phone]  Nanetta Batty, M.D. Fax: [phone]

## 2012-09-17 ENCOUNTER — Ambulatory Visit (HOSPITAL_COMMUNITY)
Admission: RE | Admit: 2012-09-17 | Discharge: 2012-09-17 | Disposition: A | Discharge status: Home / Self Care | Payer: Medicare Other | Source: Ambulatory Visit | Attending: Internal Medicine | Admitting: Internal Medicine

## 2012-09-17 DIAGNOSIS — N281 Cyst of kidney, acquired: Secondary | ICD-10-CM | POA: Insufficient documentation

## 2012-09-17 DIAGNOSIS — R932 Abnormal findings on diagnostic imaging of liver and biliary tract: Secondary | ICD-10-CM | POA: Insufficient documentation

## 2012-09-17 DIAGNOSIS — I428 Other cardiomyopathies: Secondary | ICD-10-CM

## 2012-09-17 DIAGNOSIS — K7689 Other specified diseases of liver: Secondary | ICD-10-CM | POA: Insufficient documentation

## 2012-09-17 DIAGNOSIS — R188 Other ascites: Secondary | ICD-10-CM | POA: Insufficient documentation

## 2012-09-30 ENCOUNTER — Encounter (INDEPENDENT_AMBULATORY_CARE_PROVIDER_SITE_OTHER): Payer: Self-pay

## 2012-10-07 LAB — BASIC METABOLIC PANEL
BUN: 15 mg/dL (ref 6–23)
Calcium: 9.6 mg/dL (ref 8.4–10.5)
Glucose, Bld: 96 mg/dL (ref 70–99)
Potassium: 4.4 mEq/L (ref 3.5–5.3)

## 2012-10-20 ENCOUNTER — Encounter (INDEPENDENT_AMBULATORY_CARE_PROVIDER_SITE_OTHER): Payer: Self-pay | Admitting: Internal Medicine

## 2012-10-20 ENCOUNTER — Ambulatory Visit (INDEPENDENT_AMBULATORY_CARE_PROVIDER_SITE_OTHER): Payer: Medicare Other | Admitting: Internal Medicine

## 2012-10-20 VITALS — BP 130/100 | HR 98 | Temp 97.6°F | Resp 20 | Ht 74.0 in | Wt 171.5 lb

## 2012-10-20 DIAGNOSIS — J4489 Other specified chronic obstructive pulmonary disease: Secondary | ICD-10-CM

## 2012-10-20 DIAGNOSIS — E8770 Fluid overload, unspecified: Secondary | ICD-10-CM

## 2012-10-20 DIAGNOSIS — J449 Chronic obstructive pulmonary disease, unspecified: Secondary | ICD-10-CM

## 2012-10-20 DIAGNOSIS — I10 Essential (primary) hypertension: Secondary | ICD-10-CM

## 2012-10-20 DIAGNOSIS — E8779 Other fluid overload: Secondary | ICD-10-CM

## 2012-10-20 MED ORDER — CARVEDILOL 3.125 MG PO TABS: 3.1250 mg | ORAL_TABLET | Freq: Two times a day (BID) | ORAL | Status: DC

## 2012-10-20 NOTE — Patient Instructions (Signed)
Please stop by at Dr. Juanetta Gosling office to make an appointment.

## 2012-10-20 NOTE — Progress Notes (Signed)
Presenting complaint;  Followup for fluid overload.  Subjective:  Patient is 67 year old Caucasian male who is here for scheduled visit. He was last seen on 09/15/2012 at request by Dr. Malvin Johns who thought he had ascites. He also had fluid overload and was begun on diuretic therapy. He had upper abdominal ultrasound which was negative for ascites. He has history of cardiomyopathy. He had an appointment with his cardiologist but he could not keep that appointment since he had an important meeting to go to instance in [place]. He has an appointment with his cardiologist later this month. He does not have primary care physician. He states he is 100% better. However he still has dyspnea on walking less than half a block. He denies chest pain. He also denies abdominal pain. His appetite is back to normal. He has lost  17 pounds since his last visit. Scrotal edema has resolved and LE edema is minimal.  Current Medications: Current Outpatient Prescriptions  Medication Sig Dispense Refill  . aspirin 325 MG EC tablet Take 325 mg by mouth daily.      . furosemide (LASIX) 40 MG tablet Take 40 mg by mouth daily.      . Multiple Vitamins-Minerals (CENTRUM SILVER ADULT 50+ PO) Take by mouth daily.      . Probiotic Product (ACIDOPHILUS PEARLS PO) Take by mouth daily.      Marland Kitchen spironolactone (ALDACTONE) 50 MG tablet Take 50 mg by mouth daily.       Marland Kitchen thiamine (VITAMIN B-1) 100 MG tablet Take 100 mg by mouth daily.       No current facility-administered medications for this visit.     Objective: Blood pressure 130/100, pulse 98, temperature 97.6 F (36.4 C), temperature source Oral, resp. rate 20, height 6\' 2"  (1.88 m), weight 171 lb 8 oz (77.792 kg). Patient is alert and in no acute distress. Conjunctiva is pink. Sclera is nonicteric Oropharyngeal mucosa is normal. No neck masses or thyromegaly noted. Cardiac exam with regular rhythm normal S1 and S2. No murmur or gallop noted. Breath sounds are  diminished bilaterally. Abdomen is full but soft and nontender without organomegaly or masses. Examination of external genitalia within normal limits. He has 1+ edema around his ankles.  Labs/studies Results: Metabolic 7 from 10/06/2012. Serum sodium 143, potassium 4.4, chloride 96, CO2 40, glucose 96, BUN 15, creatinine 1.06 and calcium 9.6  Assessment:  #1. Fluid overload clearly is not secondary to liver disease or cirrhosis as suspected by Dr. Malvin Johns. Ultrasound was negative for ascites. Scrotal edema has resolved. Suspect fluid overload secondary to COPD/pulmonary hypertension. He has history of nonischemic cardiomyopathy which she has not been evaluated. Has an appointment to see his cardiologist in 2 weeks. #2. Hypertension.   Plan:  Coreg 3.125 mg by mouth twice a day. I have talked with Dr. Juanetta Gosling was kindly agreed to see him for pulmonary issues and also serve as his primary care physician. Office visit in 6 months. Will consider screening colonoscopy following his next visit.

## 2012-10-30 ENCOUNTER — Other Ambulatory Visit (HOSPITAL_COMMUNITY): Payer: Self-pay

## 2012-10-30 DIAGNOSIS — J441 Chronic obstructive pulmonary disease with (acute) exacerbation: Secondary | ICD-10-CM

## 2012-11-06 ENCOUNTER — Ambulatory Visit (HOSPITAL_COMMUNITY)
Admission: RE | Admit: 2012-11-06 | Discharge: 2012-11-06 | Disposition: A | Discharge status: Home / Self Care | Payer: Medicare Other | Source: Ambulatory Visit | Attending: Pulmonary Disease | Admitting: Pulmonary Disease

## 2012-11-06 DIAGNOSIS — R0609 Other forms of dyspnea: Secondary | ICD-10-CM | POA: Insufficient documentation

## 2012-11-06 DIAGNOSIS — J4489 Other specified chronic obstructive pulmonary disease: Secondary | ICD-10-CM | POA: Insufficient documentation

## 2012-11-06 DIAGNOSIS — J449 Chronic obstructive pulmonary disease, unspecified: Secondary | ICD-10-CM | POA: Insufficient documentation

## 2012-11-06 DIAGNOSIS — R0989 Other specified symptoms and signs involving the circulatory and respiratory systems: Secondary | ICD-10-CM | POA: Insufficient documentation

## 2012-11-06 LAB — PULMONARY FUNCTION TEST

## 2012-11-06 MED ORDER — ALBUTEROL SULFATE (5 MG/ML) 0.5% IN NEBU
2.5000 mg | INHALATION_SOLUTION | Freq: Once | RESPIRATORY_TRACT | Status: AC
  Administered 2012-11-06: 2.5 mg via RESPIRATORY_TRACT

## 2012-11-12 ENCOUNTER — Other Ambulatory Visit: Payer: Self-pay | Admitting: Cardiovascular Disease

## 2012-11-13 LAB — PULMONARY FUNCTION TEST

## 2012-11-17 ENCOUNTER — Ambulatory Visit (HOSPITAL_COMMUNITY): Payer: Medicare Other | Admitting: Anesthesiology

## 2012-11-17 ENCOUNTER — Ambulatory Visit (HOSPITAL_COMMUNITY)
Admission: RE | Admit: 2012-11-17 | Discharge: 2012-11-17 | Disposition: A | Discharge status: Home / Self Care | Payer: Medicare Other | Source: Ambulatory Visit | Attending: Cardiovascular Disease | Admitting: Cardiovascular Disease

## 2012-11-17 ENCOUNTER — Encounter (HOSPITAL_COMMUNITY)
Admission: RE | Disposition: A | Discharge status: Home / Self Care | Payer: Self-pay | Source: Ambulatory Visit | Attending: Cardiovascular Disease

## 2012-11-17 ENCOUNTER — Encounter (HOSPITAL_COMMUNITY): Payer: Self-pay | Admitting: *Deleted

## 2012-11-17 ENCOUNTER — Encounter (HOSPITAL_COMMUNITY): Payer: Self-pay | Admitting: Anesthesiology

## 2012-11-17 DIAGNOSIS — J449 Chronic obstructive pulmonary disease, unspecified: Secondary | ICD-10-CM | POA: Insufficient documentation

## 2012-11-17 DIAGNOSIS — I428 Other cardiomyopathies: Secondary | ICD-10-CM | POA: Insufficient documentation

## 2012-11-17 DIAGNOSIS — F1011 Alcohol abuse, in remission: Secondary | ICD-10-CM | POA: Insufficient documentation

## 2012-11-17 DIAGNOSIS — I509 Heart failure, unspecified: Secondary | ICD-10-CM | POA: Insufficient documentation

## 2012-11-17 DIAGNOSIS — I4892 Unspecified atrial flutter: Secondary | ICD-10-CM

## 2012-11-17 DIAGNOSIS — I4430 Unspecified atrioventricular block: Secondary | ICD-10-CM | POA: Insufficient documentation

## 2012-11-17 DIAGNOSIS — Z8249 Family history of ischemic heart disease and other diseases of the circulatory system: Secondary | ICD-10-CM | POA: Insufficient documentation

## 2012-11-17 DIAGNOSIS — Z87891 Personal history of nicotine dependence: Secondary | ICD-10-CM | POA: Insufficient documentation

## 2012-11-17 DIAGNOSIS — I4891 Unspecified atrial fibrillation: Secondary | ICD-10-CM | POA: Insufficient documentation

## 2012-11-17 DIAGNOSIS — J4489 Other specified chronic obstructive pulmonary disease: Secondary | ICD-10-CM | POA: Insufficient documentation

## 2012-11-17 DIAGNOSIS — F1411 Cocaine abuse, in remission: Secondary | ICD-10-CM | POA: Insufficient documentation

## 2012-11-17 HISTORY — PX: TEE WITHOUT CARDIOVERSION: SHX5443

## 2012-11-17 HISTORY — DX: Unspecified osteoarthritis, unspecified site: M19.90

## 2012-11-17 HISTORY — PX: CARDIOVERSION: SHX1299

## 2012-11-17 HISTORY — DX: Shortness of breath: R06.02

## 2012-11-17 HISTORY — DX: Essential (primary) hypertension: I10

## 2012-11-17 HISTORY — DX: Heart failure, unspecified: I50.9

## 2012-11-17 SURGERY — ECHOCARDIOGRAM, TRANSESOPHAGEAL
Anesthesia: Moderate Sedation

## 2012-11-17 MED ORDER — FENTANYL CITRATE 0.05 MG/ML IJ SOLN
INTRAMUSCULAR | Status: DC | PRN
  Administered 2012-11-17 (×2): 25 ug via INTRAVENOUS

## 2012-11-17 MED ORDER — MIDAZOLAM HCL 5 MG/ML IJ SOLN
INTRAMUSCULAR | Status: AC
  Filled 2012-11-17: qty 2

## 2012-11-17 MED ORDER — LIDOCAINE HCL (CARDIAC) 20 MG/ML IV SOLN
INTRAVENOUS | Status: DC | PRN
  Administered 2012-11-17: 40 mg via INTRAVENOUS

## 2012-11-17 MED ORDER — MIDAZOLAM HCL 10 MG/2ML IJ SOLN
INTRAMUSCULAR | Status: DC | PRN
  Administered 2012-11-17 (×2): 2 mg via INTRAVENOUS

## 2012-11-17 MED ORDER — PROPOFOL 10 MG/ML IV BOLUS
INTRAVENOUS | Status: DC | PRN
  Administered 2012-11-17: 50 mg via INTRAVENOUS

## 2012-11-17 MED ORDER — BUTAMBEN-TETRACAINE-BENZOCAINE 2-2-14 % EX AERO
INHALATION_SPRAY | CUTANEOUS | Status: DC | PRN
  Administered 2012-11-17: 2 via TOPICAL

## 2012-11-17 MED ORDER — SODIUM CHLORIDE 0.9 % IV SOLN
INTRAVENOUS | Status: DC
  Administered 2012-11-17: 500 mL via INTRAVENOUS

## 2012-11-17 MED ORDER — FENTANYL CITRATE 0.05 MG/ML IJ SOLN
INTRAMUSCULAR | Status: AC
  Filled 2012-11-17: qty 2

## 2012-11-17 NOTE — CV Procedure (Signed)
INDICATIONS: precardioversion  PROCEDURE:   Informed consent was obtained prior to the procedure. The risks, benefits and alternatives for the procedure were discussed and the patient comprehended these risks.  Risks include, but are not limited to, cough, sore throat, vomiting, nausea, somnolence, esophageal and stomach trauma or perforation, bleeding, low blood pressure, aspiration, pneumonia, infection, trauma to the teeth and death.    After a procedural time-out, the oropharynx was anesthetized with 20% benzocaine spray. The patient was given 4 mg versed and 50 mcg fentanyl for moderate sedation.   The transesophageal probe was inserted in the esophagus and stomach without difficulty and multiple views were obtained.  The patient was kept under observation until the patient left the procedure room.  The patient left the procedure room in stable condition.   Agitated microbubble saline contrast was not administered.  COMPLICATIONS:   There were no immediate complications.  FINDINGS:  No left atrial thrombus. Depressed LV EF 35%, global hypokinesis. Mildly dilated right atrium.  RECOMMENDATIONS:   Proceed with cardioversion.  Time Spent Directly with the Patient:  30 minutes   Jayquon Theiler 11/17/2012, 9:11 AM

## 2012-11-17 NOTE — CV Procedure (Signed)
Procedure: Electrical Cardioversion Indications:  Atrial Flutter  Procedure Details:  Consent: Risks of procedure as well as the alternatives and risks of each were explained to the (patient/caregiver).  Consent for procedure obtained.  Time Out: Verified patient identification, verified procedure, site/side was marked, verified correct patient position, special equipment/implants available, medications/allergies/relevent history reviewed, required imaging and test results available.  Performed  Patient placed on cardiac monitor, pulse oximetry, supplemental oxygen as necessary.  Sedation given: propofol 50 mg IV, Dr. Gypsy Balsam Pacer pads placed anterior and posterior chest.  Cardioverted 1 time(s).  Cardioverted at 120J, synchronized biphasic  Evaluation: Findings: Post procedure EKG shows: NSR Complications: None Patient did tolerate procedure well.  Time Spent Directly with the Patient:    Vashti Bolanos 11/17/2012, 9:44 AM

## 2012-11-17 NOTE — Transfer of Care (Signed)
Immediate Anesthesia Transfer of Care Note  Patient: Ronald Owens  Procedure(s) Performed: Procedure(s): TRANSESOPHAGEAL ECHOCARDIOGRAM (TEE) (N/A) CARDIOVERSION (N/A)  Patient Location: Endoscopy Unit  Anesthesia Type:General  Level of Consciousness: awake, alert  and patient cooperative  Airway & Oxygen Therapy: Patient Spontanous Breathing and Patient connected to nasal cannula oxygen  Post-op Assessment: Report given to PACU RN and Post -op Vital signs reviewed and stable  Post vital signs: Reviewed and stable  Complications: No apparent anesthesia complications

## 2012-11-17 NOTE — Progress Notes (Signed)
  Echocardiogram Echocardiogram Transesophageal has been performed.  Ronald Owens 11/17/2012, 9:49 AM

## 2012-11-17 NOTE — Anesthesia Preprocedure Evaluation (Signed)
Anesthesia Evaluation  Patient identified by MRN, date of birth, ID band Patient awake    Reviewed: Allergy & Precautions, H&P , NPO status , Patient's Chart, lab work & pertinent test results  Airway Mallampati: I TM Distance: >3 FB Neck ROM: Full    Dental   Pulmonary shortness of breath, COPDformer smoker,  + rhonchi         Cardiovascular hypertension, + dysrhythmias Atrial Fibrillation Rhythm:Irregular Rate:Normal     Neuro/Psych    GI/Hepatic   Endo/Other    Renal/GU      Musculoskeletal   Abdominal   Peds  Hematology   Anesthesia Other Findings   Reproductive/Obstetrics                           Anesthesia Physical Anesthesia Plan  ASA: III  Anesthesia Plan: General   Post-op Pain Management:    Induction: Intravenous  Airway Management Planned: Mask  Additional Equipment:   Intra-op Plan:   Post-operative Plan:   Informed Consent: I have reviewed the patients History and Physical, chart, labs and discussed the procedure including the risks, benefits and alternatives for the proposed anesthesia with the patient or authorized representative who has indicated his/her understanding and acceptance.     Plan Discussed with: CRNA and Surgeon  Anesthesia Plan Comments:         Anesthesia Quick Evaluation

## 2012-11-17 NOTE — H&P (Signed)
  Date of Initial H&P: 11/11/12  History reviewed, patient examined, no change in status, stable for surgery. TEE/DC cardioversion for symptomatic atrial flutter with 2:1 AV block. This procedure has been fully reviewed with the patient and written informed consent has been obtained. Thurmon Fair, MD, [facility] [phone] office [phone] pager

## 2012-11-17 NOTE — Anesthesia Postprocedure Evaluation (Signed)
  Anesthesia Post-op Note  Patient: Ronald Owens  Procedure(s) Performed: Procedure(s): TRANSESOPHAGEAL ECHOCARDIOGRAM (TEE) (N/A) CARDIOVERSION (N/A)  Patient Location: Endoscopy Unit  Anesthesia Type:General  Level of Consciousness: awake, alert  and oriented  Airway and Oxygen Therapy: Patient Spontanous Breathing and Patient connected to nasal cannula oxygen  Post-op Pain: none  Post-op Assessment: Post-op Vital signs reviewed, Patient's Cardiovascular Status Stable, Respiratory Function Stable, Patent Airway and No signs of Nausea or vomiting  Post-op Vital Signs: Reviewed and stable  Complications: No apparent anesthesia complications

## 2012-11-18 ENCOUNTER — Encounter (HOSPITAL_COMMUNITY): Payer: Self-pay | Admitting: Cardiovascular Disease

## 2012-11-18 NOTE — Procedures (Signed)
NAME:  Ronald Owens, Ronald Owens NO.:  [id number]  MEDICAL RECORD NO.:  [id number]  LOCATION:  [facility]                         FACILITY:  [facility]  PHYSICIAN:  Tassie Pollett L. Juanetta Gosling, M.D.DATE OF BIRTH:  September 07, 1945  DATE OF PROCEDURE:  11/17/2012 DATE OF DISCHARGE:  11/17/2012                           PULMONARY FUNCTION TEST   REASON FOR PULMONARY FUNCTION TESTING:  Chronic obstructive pulmonary disease exacerbation.  1. Spirometry shows a severe ventilatory defect with evidence of     airflow obstruction. 2. Lung volumes show marked air trapping. 3. Airway resistance is very high. 4. There is improvement with inhaled bronchodilator that approaches     the level of significance. 5. The patient could not do DLCO determination.     Allani Reber L. Juanetta Gosling, M.D.     ELH/MEDQ  D:  11/17/2012  T:  11/18/2012  Job:  [id number]

## 2012-11-26 LAB — PULMONARY FUNCTION TEST

## 2012-12-16 ENCOUNTER — Ambulatory Visit: Payer: Medicare Other | Admitting: Cardiovascular Disease

## 2012-12-17 ENCOUNTER — Encounter: Payer: Self-pay | Admitting: *Deleted

## 2012-12-17 ENCOUNTER — Encounter: Payer: Self-pay | Admitting: Cardiovascular Disease

## 2012-12-17 ENCOUNTER — Ambulatory Visit (INDEPENDENT_AMBULATORY_CARE_PROVIDER_SITE_OTHER): Payer: Medicare Other | Admitting: Cardiovascular Disease

## 2012-12-17 VITALS — BP 140/90 | HR 73 | Resp 18 | Ht 74.0 in | Wt 175.2 lb

## 2012-12-17 DIAGNOSIS — I5022 Chronic systolic (congestive) heart failure: Secondary | ICD-10-CM

## 2012-12-17 DIAGNOSIS — I4819 Other persistent atrial fibrillation: Secondary | ICD-10-CM

## 2012-12-17 DIAGNOSIS — I4891 Unspecified atrial fibrillation: Secondary | ICD-10-CM

## 2012-12-17 DIAGNOSIS — R000 Tachycardia, unspecified: Secondary | ICD-10-CM

## 2012-12-17 DIAGNOSIS — R9431 Abnormal electrocardiogram [ECG] [EKG]: Secondary | ICD-10-CM | POA: Insufficient documentation

## 2012-12-17 DIAGNOSIS — J449 Chronic obstructive pulmonary disease, unspecified: Secondary | ICD-10-CM

## 2012-12-17 DIAGNOSIS — I43 Cardiomyopathy in diseases classified elsewhere: Secondary | ICD-10-CM | POA: Insufficient documentation

## 2012-12-17 DIAGNOSIS — I509 Heart failure, unspecified: Secondary | ICD-10-CM

## 2012-12-17 NOTE — Assessment & Plan Note (Signed)
He had immediate improvement in symptoms following the cardioversion and resolution of the atrial fibrillation. He had slow progressive continuing improvement after that. I believe the 2 staged improvement was related to resolution of the arrhythmia itself followed by a resolution of associated cardiomyopathy.  He is on appropriate anticoagulant therapy. He has not had any bleeding complications. In this and the necessity of him interrupted anticoagulations since the recurrence of atrial for progression cannot be predicted and may not immediately be associated with symptoms.

## 2012-12-17 NOTE — Patient Instructions (Addendum)
Return in 6 months.  Continue same medications.  Echocardiogram in 5 months.

## 2012-12-17 NOTE — Assessment & Plan Note (Addendum)
He appears to be euvolemic clinically, now NYHA functional class II. The progressive improvement that he had following the cardioversion is probably related to resolution of tachycardia induced cardiomyopathy. I believe we should document this with an echocardiogram performed prior to his next appointment. If he still has depressed left ventricular systolic function despite maintenance of normal rhythm, we then need to look for other causes of his cardiomyopathy including the possibility of coronary insufficiency. He is on treatment with carvedilol as well as renin angiotensin aldosterone system inhibitors. He is not specifically on an ACE inhibitor, but I would not add this to spinal lactone at this time. If his left ventricular ejection fraction remains low at the time of his followup echo we may have to reconsider this.

## 2012-12-17 NOTE — Assessment & Plan Note (Signed)
The QT interval is in the expected therapeutic range for treatment with sotalol. This should be monitored at least once every 6 months. We discussed the importance of avoiding QT prolonging drugs. He should ask the prescribing physician and his pharmacist about a potential of sotalol interaction whenever he gets a new medication. Potassium levels she also be monitored at least twice yearly, more often if his diuretic prescription is changed.

## 2012-12-17 NOTE — Assessment & Plan Note (Addendum)
Left ventricular ejection fraction was 35-40% at the time of his transesophageal echocardiogram. Note ejection fraction of 20-25% when he presented with atrial flutter in 2008, improvement to 55% by the time he had followup echocardiogram in 2011. Suspicion is that he also had tachycardia and is cardiopathy at that time. Normal coronary angiography in 2008.

## 2012-12-17 NOTE — Progress Notes (Signed)
Patient ID: Ronald Owens, male   DOB: 07/23/1945, 67 y.o.   MRN: [id number]     Reason for office visit Followup atrial fibrillation status post cardioversion  Ronald Owens returns roughly one month following TEE guided cardioversion for persistent atrial fibrillation. At the time of TEE he was found to have an ejection fraction of 35-40% secondary to global left ventricular hypokinesis. He felt immediately better when he returned home after his cardioversion but has noticed further gradual improvement over time. He can now walk to and from his mailbox without stopping to catch his breath and feels that he is breathing "better than I ever had". He attributes his improvement to a new bronchodilator prescription provided by Dr. Juanetta Owens. He denies any chest pain syncope palpitations dizziness lightheadedness or bleeding problem. Has not had any focal nonfocal deficits or other signs of cardiomyopathy events. He is planning a trip to [place] and [place], [place]    Allergies  Allergen Reactions  . Lisinopril Cough    Current Outpatient Prescriptions  Medication Sig Dispense Refill  . albuterol (PROVENTIL HFA;VENTOLIN HFA) 108 (90 BASE) MCG/ACT inhaler Inhale 2 puffs into the lungs every 6 (six) hours as needed for wheezing.      . Fluticasone Furoate-Vilanterol (BREO ELLIPTA) 100-25 MCG/INH AEPB Inhale 2 puffs into the lungs daily.      . furosemide (LASIX) 40 MG tablet Take 40 mg by mouth daily.      . Probiotic Product (ACIDOPHILUS PEARLS PO) Take by mouth daily.      . rivaroxaban (XARELTO) 10 MG TABS tablet Take 15 mg by mouth daily.      . sotalol (BETAPACE) 80 MG tablet Take 80 mg by mouth 2 (two) times daily.      Marland Kitchen spironolactone (ALDACTONE) 50 MG tablet Take 50 mg by mouth daily.       Marland Kitchen thiamine (VITAMIN B-1) 100 MG tablet Take 100 mg by mouth daily.      Marland Kitchen tiotropium (SPIRIVA) 18 MCG inhalation capsule Place 18 mcg into inhaler and inhale daily.      . carvedilol (COREG) 3.125  MG tablet Take 1 tablet (3.125 mg total) by mouth 2 (two) times daily with a meal.  60 tablet  5  . Multiple Vitamins-Minerals (CENTRUM SILVER ADULT 50+ PO) Take by mouth daily.       No current facility-administered medications for this visit.    Past Medical History  Diagnosis Date  . Anemia     At age 66  . Shortness of breath   . Hypertension   . CHF (congestive heart failure)   . Arthritis   . PAF (paroxysmal atrial fibrillation)   . Cardiomyopathy     tachycardia mediated  . Cocaine abuse in remission   . Alcohol abuse, in remission   . Dyslipidemia     Past Surgical History  Procedure Laterality Date  . Hemorroidectomy  [id number]    At [facility]  . Colonoscopy      Patient had done 25 years ago Dr.Rehman @ [facility]  . Mastoidectomy      Patient had this surgery at age 23  . Upper gastrointestinal endoscopy      This was done 25 years ago at the time of TCS  . Wisdom tooth extraction    . Tee without cardioversion N/A 11/17/2012    Procedure: TRANSESOPHAGEAL ECHOCARDIOGRAM (TEE);  Surgeon: Thurmon Fair, MD;  Location: [facility] ENDOSCOPY;  Service: Cardiovascular;  Laterality: N/A;  . Cardioversion N/A 11/17/2012  Procedure: CARDIOVERSION;  Surgeon: Thurmon Fair, MD;  Location: [facility] ENDOSCOPY;  Service: Cardiovascular;  Laterality: N/A;  . Tee with cardioversion  03/2010  . US echocardiography  09/19/2009    patent foramen ovale suspected,trace MR,mild TR    Family History  Problem Relation Age of Onset  . Pancreatic cancer Mother   . Healthy Sister   . Healthy Brother   . Healthy Sister   . Healthy Brother     History   Social History  . Marital Status: Divorced    Spouse Name: N/A    Number of Children: N/A  . Years of Education: N/A   Occupational History  . Not on file.   Social History Main Topics  . Smoking status: Former Smoker    Types: Cigarettes    Quit date: 09/16/2007  . Smokeless tobacco: Never Used  . Alcohol Use: No     Comment: ETOH-last use 15  years ago  . Drug Use: No     Comment: Cocaine-last use 15 years ago  . Sexually Active: Not on file   Other Topics Concern  . Not on file   Social History Narrative  . No narrative on file    Review of systems: The patient specifically denies any chest pain at rest or with exertion, dyspnea at rest or with light exertion, orthopnea, paroxysmal nocturnal dyspnea, syncope, palpitations, focal neurological deficits, intermittent claudication, lower extremity edema, unexplained weight gain, he has occasional cough, without hemoptysis and has a marked reduction in the frequency of wheezing.  The patient also denies abdominal pain, nausea, vomiting, dysphagia, diarrhea, constipation, polyuria, polydipsia, dysuria, hematuria, frequency, urgency, abnormal bleeding or bruising, fever, chills, unexpected weight changes, mood swings, change in skin or hair texture, change in voice quality, auditory or visual problems, allergic reactions or rashes, new musculoskeletal complaints other than usual "aches and pains".   PHYSICAL EXAM BP 140/90  Pulse 73  Resp 18  Ht 6\' 2"  (1.88 m)  Wt 175 lb 3.2 oz (79.47 kg)  BMI 22.48 kg/m2  General: Alert, oriented x3, no distress Head: no evidence of trauma, PERRL, EOMI, no exophtalmos or lid lag, no myxedema, no xanthelasma; normal ears, nose and oropharynx Neck: normal jugular venous pulsations and no hepatojugular reflux; brisk carotid pulses without delay and no carotid bruits Chest: clear to auscultation, no signs of consolidation by percussion or palpation, normal fremitus, symmetrical and full respiratory excursions Cardiovascular: normal position and quality of the apical impulse, regular rhythm, normal first and second heart sounds, no murmurs, rubs or gallops Abdomen: no tenderness or distention, no masses by palpation, no abnormal pulsatility or arterial bruits, normal bowel sounds, no hepatosplenomegaly Extremities: no clubbing, cyanosis or edema; 2+  radial, ulnar and brachial pulses bilaterally; 2+ right femoral, posterior tibial and dorsalis pedis pulses; 2+ left femoral, posterior tibial and dorsalis pedis pulses; no subclavian or femoral bruits Neurological: grossly nonfocal   EKG: Sinus rhythm, prominent downsloping ST segment depression with inverted T waves in leads V3 through V6 II, III, and F and a prolonged QT interval (QTC 484 ms  Lipid Panel     Component Value Date/Time   CHOL  Value: 172        ATP III CLASSIFICATION:  <200     mg/dL   Desirable  409-811  mg/dL   Borderline High  >=914    mg/dL   High 7/82/9562 1308   TRIG 86 12/23/2006 0400   HDL 41 12/23/2006 0400   CHOLHDL 4.2 12/23/2006 0400  VLDL 17 12/23/2006 0400   LDLCALC  Value: 114        Total Cholesterol/HDL:CHD Risk Coronary Heart Disease Risk Table                     Men   Women  1/2 Average Risk   3.4   3.3* 12/23/2006 0400    BMET    Component Value Date/Time   NA 143 10/06/2012 1415   K 4.4 10/06/2012 1415   CL 96 10/06/2012 1415   CO2 40* 10/06/2012 1415   GLUCOSE 96 10/06/2012 1415   BUN 15 10/06/2012 1415   CREATININE 1.06 10/06/2012 1415   CREATININE 1.12 12/26/2006 0310   CALCIUM 9.6 10/06/2012 1415   GFRNONAA >60 12/26/2006 0310   GFRAA  Value: >60        The eGFR has been calculated using the MDRD equation. This calculation has not been validated in all clinical 12/26/2006 0310     ASSESSMENT AND PLAN Chronic systolic CHF (congestive heart failure) He appears to be euvolemic clinically, now NYHA functional class II. The progressive improvement that he had following the cardioversion is probably related to resolution of tachycardia induced cardiomyopathy. I believe we should document this with an echocardiogram performed prior to his next appointment. If he still has depressed left ventricular systolic function despite maintenance of normal rhythm, we then need to look for other causes of his cardiomyopathy including the possibility of coronary insufficiency. He  is on treatment with carvedilol as well as renin angiotensin aldosterone system inhibitors. He is not specifically on an ACE inhibitor, but I would not add this to spinal lactone at this time. If his left ventricular ejection fraction remains low at the time of his followup echo we may have to reconsider this.  Persistent atrial fibrillation He had immediate improvement in symptoms following the cardioversion and resolution of the atrial fibrillation. He had slow progressive continuing improvement after that. I believe the 2 staged improvement was related to resolution of the arrhythmia itself followed by a resolution of associated cardiomyopathy.  He is on appropriate anticoagulant therapy. He has not had any bleeding complications. In this and the necessity of him interrupted anticoagulations since the recurrence of atrial for progression cannot be predicted and may not immediately be associated with symptoms.  Tachycardia induced cardiomyopathy Left ventricular ejection fraction was 35-40% at the time of his transesophageal echocardiogram. Note ejection fraction of 20-25% when he presented with atrial flutter in 2008, improvement to 55% by the time he had followup echocardiogram in 2011. Suspicion is that he also had tachycardia and is cardiopathy at that time. Normal coronary angiography in 2008.  QT prolongation The QT interval is in the expected therapeutic range for treatment with sotalol. This should be monitored at least once every 6 months. We discussed the importance of avoiding QT prolonging drugs. He should ask the prescribing physician and his pharmacist about a potential of sotalol interaction whenever he gets a new medication. Potassium levels she also be monitored at least twice yearly, more often if his diuretic prescription is changed.  COPD (chronic obstructive pulmonary disease) He believes that a large part of his improvement in breathing is related to his new inhaler and I cannot  dispute this. My suspicion is that most of the improvement is related to gradual resolution of tachycardia-induced cardiomyopathy.  His most recent arrhythmia appears to be typical counterclockwise right atrial flutter but there is documentation of atrial fibrillation in the past. Either  way if he has frequent recurrence of atrial tachyarrhythmia I believe he would be a good candidate for radiofrequency ablation.  Orders Placed This Encounter  Procedures  . EKG 12-Lead  . 2D Echocardiogram without contrast   Meds ordered this encounter  Medications  . Fluticasone Furoate-Vilanterol (BREO ELLIPTA) 100-25 MCG/INH AEPB    Sig: Inhale 2 puffs into the lungs daily.  Marland Kitchen albuterol (PROVENTIL HFA;VENTOLIN HFA) 108 (90 BASE) MCG/ACT inhaler    Sig: Inhale 2 puffs into the lungs every 6 (six) hours as needed for wheezing.    Junious Silk, MD, [facility] [phone] office [phone] pager

## 2012-12-17 NOTE — Assessment & Plan Note (Signed)
He believes that a large part of his improvement in breathing is related to his new inhaler and I cannot dispute this. My suspicion is that most of the improvement is related to gradual resolution of tachycardia-induced cardiomyopathy.

## 2012-12-18 ENCOUNTER — Encounter: Payer: Self-pay | Admitting: Cardiovascular Disease

## 2013-04-26 ENCOUNTER — Ambulatory Visit (INDEPENDENT_AMBULATORY_CARE_PROVIDER_SITE_OTHER): Payer: Medicare Other | Admitting: Internal Medicine

## 2013-05-12 ENCOUNTER — Ambulatory Visit (INDEPENDENT_AMBULATORY_CARE_PROVIDER_SITE_OTHER): Payer: Medicare Other | Admitting: Internal Medicine

## 2013-05-12 ENCOUNTER — Encounter (INDEPENDENT_AMBULATORY_CARE_PROVIDER_SITE_OTHER): Payer: Self-pay | Admitting: Internal Medicine

## 2013-05-12 VITALS — BP 118/60 | HR 64 | Temp 99.1°F | Ht 74.0 in | Wt 182.3 lb

## 2013-05-12 DIAGNOSIS — Z1211 Encounter for screening for malignant neoplasm of colon: Secondary | ICD-10-CM | POA: Insufficient documentation

## 2013-05-12 NOTE — Patient Instructions (Signed)
Colonoscopy

## 2013-05-12 NOTE — Progress Notes (Signed)
Subjective:     Patient ID: Ronald Owens, male   DOB: 04/07/1946, 67 y.o.   MRN: [id number]  HPI Here today for f/u.  Last seen in April. He was evaluated at the request of Dr. Malvin Johns for possible ascites.  He had an upper abdominal US which was negative for ascites.  Hx of cardiomyopathy. Patient is 67 year old Caucasian male who is here for scheduled visit. He was last seen on 09/15/2012 at request by Dr. Malvin Johns who thought he had ascites. He also had fluid overload and was begun on diuretic therapy. He had upper abdominal ultrasound which was negative for ascites.   He will see Dr. Juanetta Gosling for his pulmonary issues.  He actually saw Dr. Juanetta Gosling last month.  His cardiologist is Recruitment consultant. 11/17/12 Echocardiogram revealed EF 40%. Recently underwent a cardioversion in May for atrial fib. He tells me now he is in atrial fib.  Appetite is good. He has lost about 9 pounds since his last visit. He denies edema to his lower extremities. No rectal bleeding. Usually has a BM every day. Quit smoking 5 yrs ago.  No drugs in 15 yrs. Review of Systems see hpi Current Outpatient Prescriptions  Medication Sig Dispense Refill  . albuterol (PROVENTIL HFA;VENTOLIN HFA) 108 (90 BASE) MCG/ACT inhaler Inhale 2 puffs into the lungs every 6 (six) hours as needed for wheezing.      . Fluticasone Furoate-Vilanterol (BREO ELLIPTA) 100-25 MCG/INH AEPB Inhale 2 puffs into the lungs daily.      . furosemide (LASIX) 40 MG tablet Take 40 mg by mouth daily.      . Multiple Vitamins-Minerals (CENTRUM SILVER ADULT 50+ PO) Take by mouth daily.      . Probiotic Product (ACIDOPHILUS PEARLS PO) Take by mouth daily.      . rivaroxaban (XARELTO) 10 MG TABS tablet Take 15 mg by mouth daily.      . sotalol (BETAPACE) 80 MG tablet Take 80 mg by mouth 2 (two) times daily.      Marland Kitchen spironolactone (ALDACTONE) 50 MG tablet Take 50 mg by mouth daily.       Marland Kitchen thiamine (VITAMIN B-1) 100 MG tablet Take 100 mg by mouth daily.      Marland Kitchen  tiotropium (SPIRIVA) 18 MCG inhalation capsule Place 18 mcg into inhaler and inhale daily.       No current facility-administered medications for this visit.   Past Medical History  Diagnosis Date  . Anemia     At age 67  . Shortness of breath   . Hypertension   . CHF (congestive heart failure)   . Arthritis   . PAF (paroxysmal atrial fibrillation)   . Cardiomyopathy     tachycardia mediated  . Cocaine abuse in remission   . Alcohol abuse, in remission   . Dyslipidemia    Past Surgical History  Procedure Laterality Date  . Hemorroidectomy  [id number]    At [facility]  . Colonoscopy      Patient had done 25 years ago Dr.Rehman @ [facility]  . Mastoidectomy      Patient had this surgery at age 67  . Upper gastrointestinal endoscopy      This was done 25 years ago at the time of TCS  . Wisdom tooth extraction    . Tee without cardioversion N/A 11/17/2012    Procedure: TRANSESOPHAGEAL ECHOCARDIOGRAM (TEE);  Surgeon: Thurmon Fair, MD;  Location: [facility] ENDOSCOPY;  Service: Cardiovascular;  Laterality: N/A;  . Cardioversion N/A 11/17/2012  Procedure: CARDIOVERSION;  Surgeon: Thurmon Fair, MD;  Location: [facility] ENDOSCOPY;  Service: Cardiovascular;  Laterality: N/A;  . Tee with cardioversion  03/2010  . US echocardiography  09/19/2009    patent foramen ovale suspected,trace MR,mild TR   Allergies  Allergen Reactions  . Lisinopril Cough        Objective:   Physical Exam  Filed Vitals:   05/12/13 1436  BP: 118/60  Pulse: 64  Temp: 99.1 F (37.3 C)  Height: 6\' 2"  (1.88 m)  Weight: 182 lb 4.8 oz (82.691 kg)    Alert and oriented. Skin warm and dry. Oral mucosa is moist.   . Sclera anicteric, conjunctivae is pink. Thyroid not enlarged. No cervical lymphadenopathy. Lungs clear. Heart regular rate and rhythm.  Abdomen is soft. Bowel sounds are positive. No hepatomegaly. No abdominal masses felt. No tenderness.  No edema to lower extremities.       Assessment:       Screening colonoscopy.  Last colonoscopy over 25 yrs ago.  Plan:    screening colonoscopy.

## 2013-05-19 ENCOUNTER — Ambulatory Visit (HOSPITAL_COMMUNITY): Payer: Medicare Other

## 2013-06-18 ENCOUNTER — Other Ambulatory Visit: Payer: Self-pay | Admitting: *Deleted

## 2013-06-18 MED ORDER — RIVAROXABAN 20 MG PO TABS: 20.0000 mg | ORAL_TABLET | Freq: Every day | ORAL | Status: DC

## 2013-06-18 NOTE — Telephone Encounter (Signed)
Sent e-pharmacy  xarelto 20  Mg  --#30 x 6

## 2013-08-12 IMAGING — US US ABDOMEN COMPLETE
1 series · 13 of 25 positions shown · non-contrast
Comparison: None.

CLINICAL DATA: Ascites.

COMPLETE ABDOMINAL ULTRASOUND

[Series 1: us abdomen complete · 0.33mm/px · 13 of 115 slices shown]
[im 1/115]
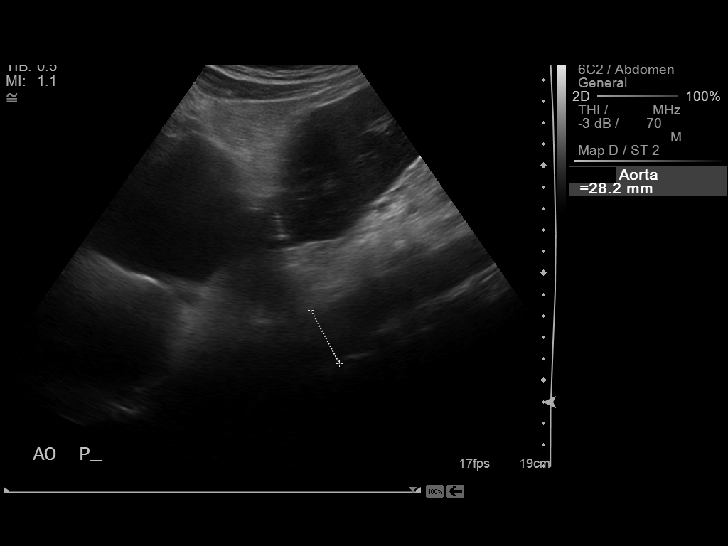
[im 10/115]
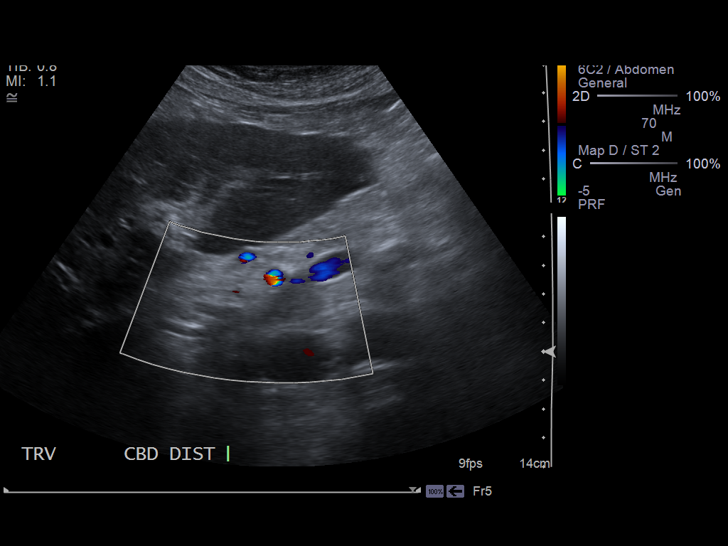
[im 20/115]
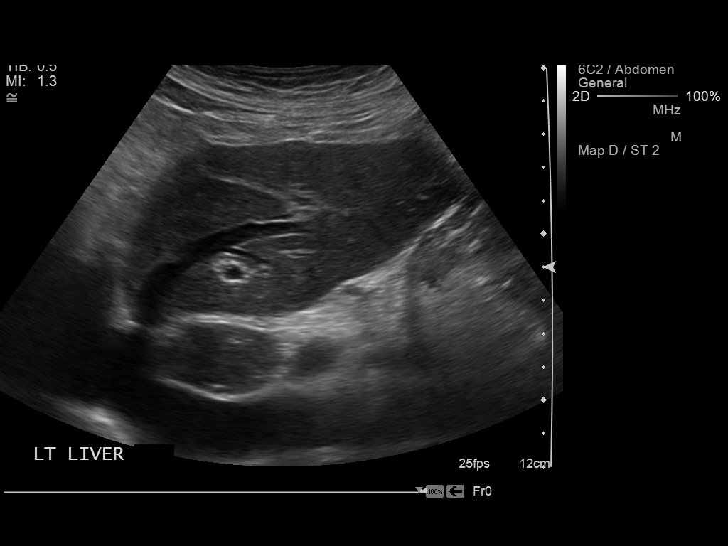
[im 29/115]
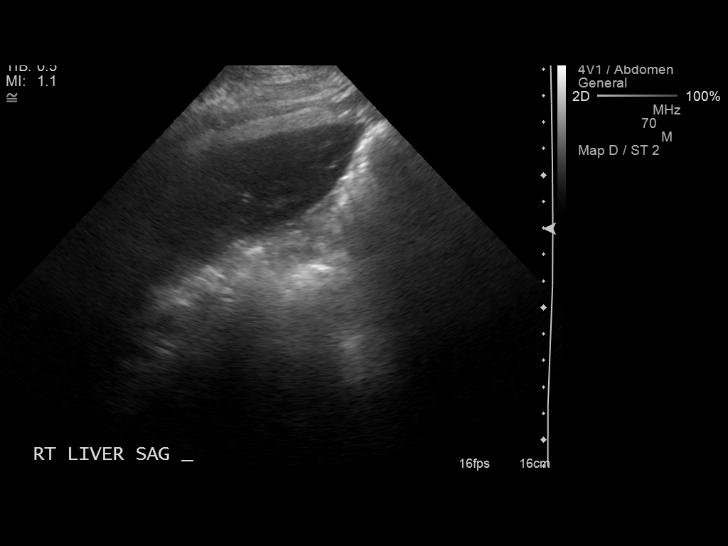
[im 39/115]
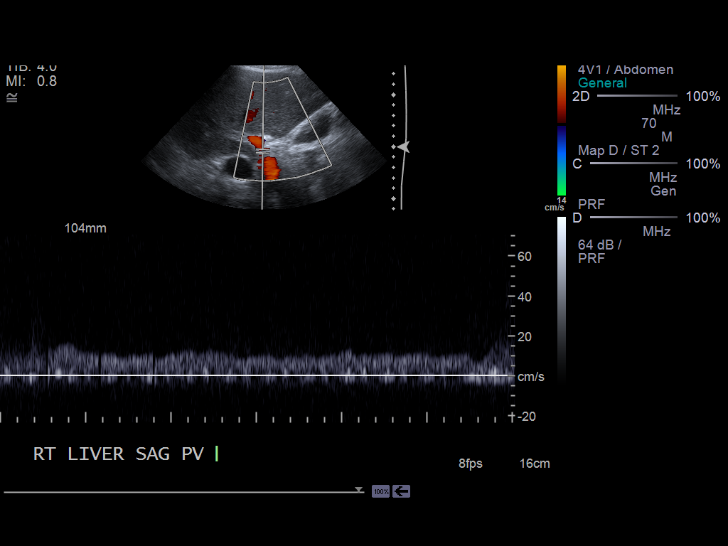
[im 48/115]
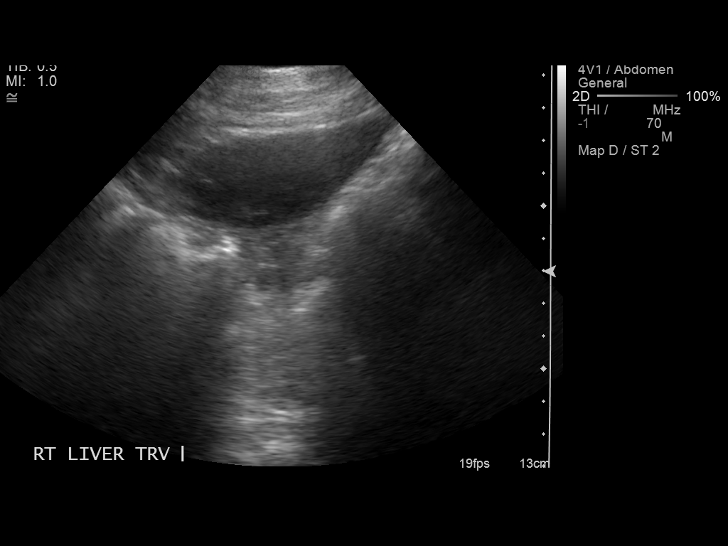
[im 58/115]
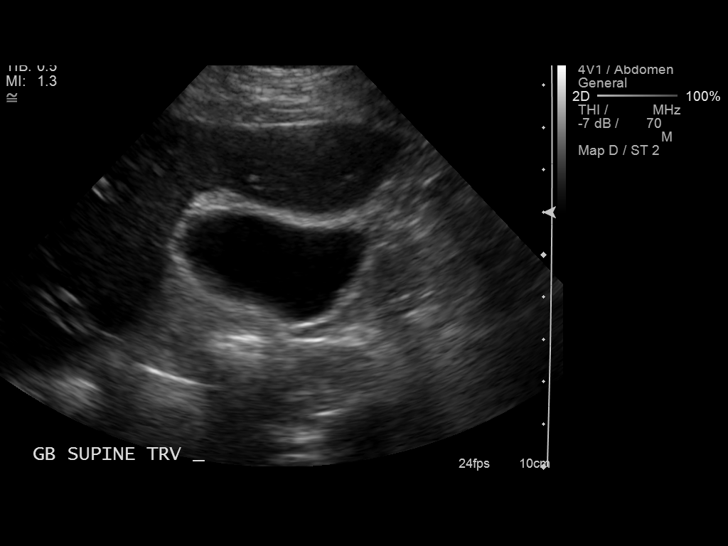
[im 67/115]
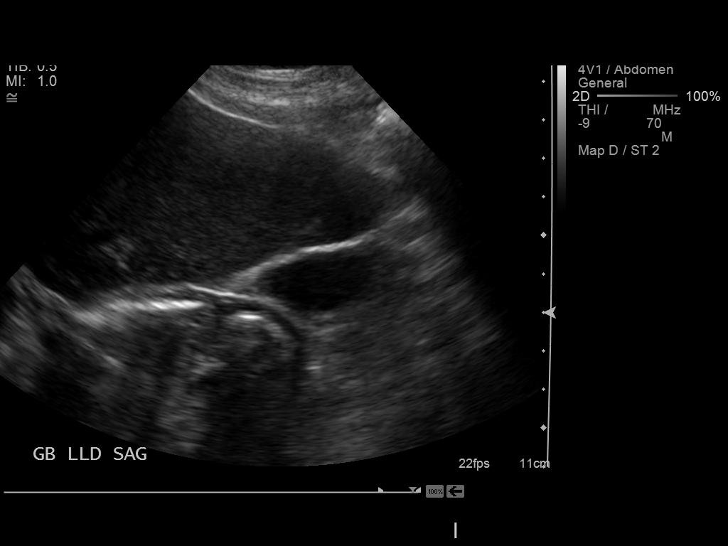
[im 77/115]
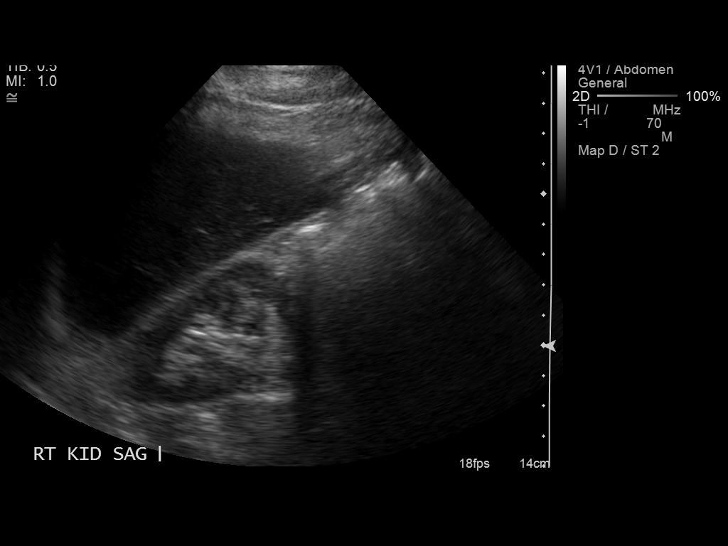
[im 86/115]
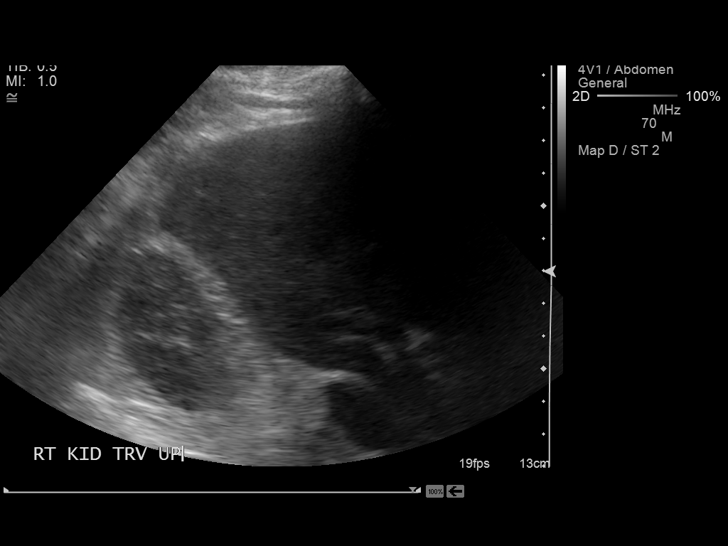
[im 96/115]
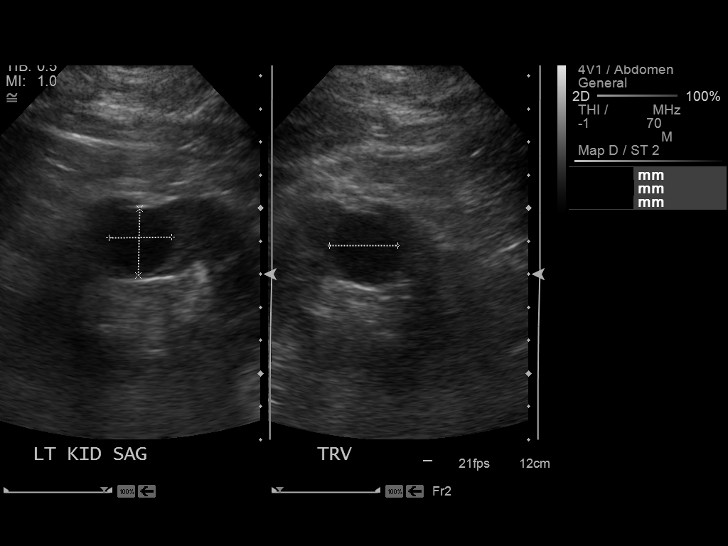
[im 105/115]
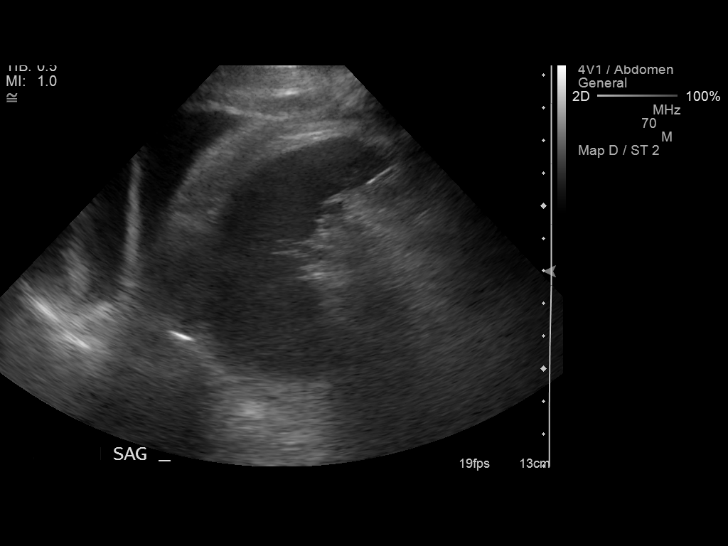
[im 115/115]
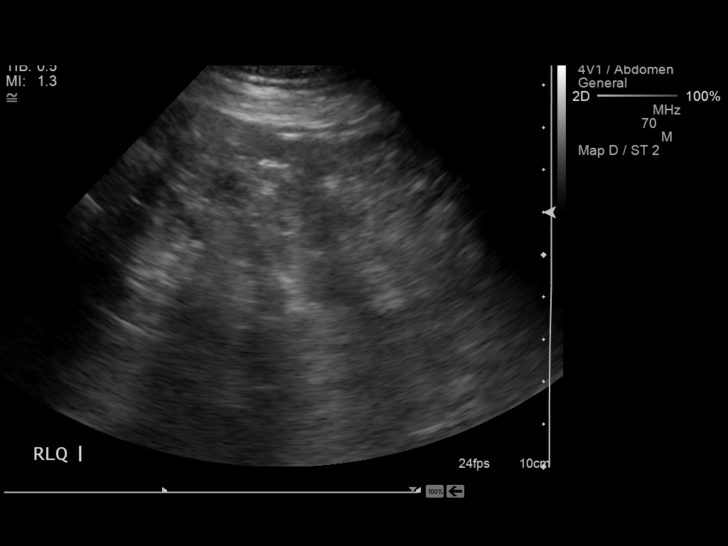

[13 of 25 positions shown; findings below may reference images not displayed]

FINDINGS: Gallbladder:  Diffuse wall thickening which is more focal in the
gallbladder fundus, measuring up to 7 mm.  No pericholecystic
fluid.  No shadowing gallstones or echogenic sludge.  Negative
sonographic Murphy's sign according to the ultrasound technologist.

Common bile duct:  Normal in caliber with maximum diameter
approximating 5 mm.

Liver:  Upper normal in size with scattered areas of focal
increased and coarsened echotexture.  No discrete hepatic masses.
Patent portal vein with hepatopetal flow.

IVC:  Patent.

Pancreas:  Normal-appearing pancreatic head and pancreatic body.
Pancreatic tail obscured by overlying bowel gas.

Spleen:  Normal size and echotexture without focal parenchymal
abnormality.

Right Kidney:  No hydronephrosis.  Well-preserved cortex.  No
shadowing calculi.  Normal size and parenchymal echotexture without
focal abnormalities.

Left Kidney:  No hydronephrosis.  Well-preserved cortex.  No
shadowing calculi.  Normal size and parenchymal echotexture.
Approximate 1.9 x 2.0-2.1 cm simple cyst arising from the upper
pole.  No significant focal parenchymal abnormality.  Approximately
10.8 cm in length.

Abdominal aorta:  Normal in caliber throughout its visualized
course in the abdomen with mild to moderate atherosclerosis.

Other findings:  Small left pleural effusion.  No visible upper
abdominal ascites.
IMPRESSION: 1.  Diffuse gallbladder wall thickening which is more focally
increased in the fundus.  Differential diagnosis includes chronic
acalculous cholecystitis, though gallbladder malignancy is not
excluded in the fundus.
2.  No evidence of cholelithiasis or acute cholecystitis
sonographically.
3.  Scattered areas of focal hepatic steatosis.  No hepatic masses.

4.  Small left pleural effusion.  No visible upper abdominal
ascites.
5.  2 cm simple cyst arising from the upper pole of the left
kidney.

## 2013-12-28 ENCOUNTER — Emergency Department (HOSPITAL_COMMUNITY)
Admission: EM | Admit: 2013-12-28 | Discharge: 2013-12-28 | Disposition: A | Discharge status: Home / Self Care | Payer: Medicare Other | Attending: Emergency Medicine | Admitting: Emergency Medicine

## 2013-12-28 ENCOUNTER — Encounter (HOSPITAL_COMMUNITY): Payer: Self-pay | Admitting: Emergency Medicine

## 2013-12-28 DIAGNOSIS — Z8739 Personal history of other diseases of the musculoskeletal system and connective tissue: Secondary | ICD-10-CM | POA: Insufficient documentation

## 2013-12-28 DIAGNOSIS — Z79899 Other long term (current) drug therapy: Secondary | ICD-10-CM | POA: Insufficient documentation

## 2013-12-28 DIAGNOSIS — I4891 Unspecified atrial fibrillation: Secondary | ICD-10-CM | POA: Insufficient documentation

## 2013-12-28 DIAGNOSIS — I10 Essential (primary) hypertension: Secondary | ICD-10-CM | POA: Insufficient documentation

## 2013-12-28 DIAGNOSIS — I509 Heart failure, unspecified: Secondary | ICD-10-CM | POA: Insufficient documentation

## 2013-12-28 DIAGNOSIS — S81809A Unspecified open wound, unspecified lower leg, initial encounter: Principal | ICD-10-CM

## 2013-12-28 DIAGNOSIS — Y9389 Activity, other specified: Secondary | ICD-10-CM | POA: Insufficient documentation

## 2013-12-28 DIAGNOSIS — Y929 Unspecified place or not applicable: Secondary | ICD-10-CM | POA: Insufficient documentation

## 2013-12-28 DIAGNOSIS — S91009A Unspecified open wound, unspecified ankle, initial encounter: Principal | ICD-10-CM

## 2013-12-28 DIAGNOSIS — Z23 Encounter for immunization: Secondary | ICD-10-CM | POA: Insufficient documentation

## 2013-12-28 DIAGNOSIS — IMO0002 Reserved for concepts with insufficient information to code with codable children: Secondary | ICD-10-CM | POA: Insufficient documentation

## 2013-12-28 DIAGNOSIS — S81009A Unspecified open wound, unspecified knee, initial encounter: Secondary | ICD-10-CM | POA: Insufficient documentation

## 2013-12-28 DIAGNOSIS — Z7901 Long term (current) use of anticoagulants: Secondary | ICD-10-CM | POA: Insufficient documentation

## 2013-12-28 DIAGNOSIS — Z87891 Personal history of nicotine dependence: Secondary | ICD-10-CM | POA: Insufficient documentation

## 2013-12-28 DIAGNOSIS — Z8639 Personal history of other endocrine, nutritional and metabolic disease: Secondary | ICD-10-CM | POA: Insufficient documentation

## 2013-12-28 DIAGNOSIS — Z862 Personal history of diseases of the blood and blood-forming organs and certain disorders involving the immune mechanism: Secondary | ICD-10-CM | POA: Insufficient documentation

## 2013-12-28 MED ORDER — LIDOCAINE-EPINEPHRINE (PF) 1 %-1:200000 IJ SOLN
10.0000 mL | Freq: Once | INTRAMUSCULAR | Status: AC
  Administered 2013-12-28: 10 mL

## 2013-12-28 MED ORDER — TETANUS-DIPHTH-ACELL PERTUSSIS 5-2.5-18.5 LF-MCG/0.5 IM SUSP
0.5000 mL | Freq: Once | INTRAMUSCULAR | Status: AC
  Administered 2013-12-28: 0.5 mL via INTRAMUSCULAR
  Filled 2013-12-28: qty 0.5

## 2013-12-28 NOTE — ED Notes (Signed)
No active bleeding, dressed with telfa, kling .

## 2013-12-28 NOTE — Discharge Instructions (Signed)
Stitches, Staples, or Skin Adhesive Strips  °Stitches (sutures), staples, and skin adhesive strips hold the skin together as it heals. They will usually be in place for 7 days or less. °HOME CARE °· Wash your hands with soap and water before and after you touch your wound. °· Only take medicine as told by your doctor. °· Cover your wound only if your doctor told you to. Otherwise, leave it open to air. °· Do not get your stitches wet or dirty. If they get dirty, dab them gently with a clean washcloth. Wet the washcloth with soapy water. Do not rub. Pat them dry gently. °· Do not put medicine or medicated cream on your stitches unless your doctor told you to. °· Do not take out your own stitches or staples. Skin adhesive strips will fall off by themselves. °· Do not pick at the wound. Picking can cause an infection. °· Do not miss your follow-up appointment. °· If you have problems or questions, call your doctor. °GET HELP RIGHT AWAY IF:  °· You have a temperature by mouth above 102° F (38.9° C), not controlled by medicine. °· You have chills. °· You have redness or pain around your stitches. °· There is puffiness (swelling) around your stitches. °· You notice fluid (drainage) from your stitches. °· There is a bad smell coming from your wound. °MAKE SURE YOU: °· Understand these instructions. °· Will watch your condition. °· Will get help if you are not doing well or get worse. °Document Released: 04/21/2009 Document Revised: 09/16/2011 Document Reviewed: 04/21/2009 °[employer]® Patient Information ©2015 [employer]. This information is not intended to replace advice given to you by your health care provider. Make sure you discuss any questions you have with your health care provider. ° °

## 2013-12-28 NOTE — ED Notes (Signed)
Pt struck leg on a door last night and it began to bleed while he was sleeping.

## 2013-12-28 NOTE — ED Notes (Signed)
Pt struck rt lower leg at  Mid shaft of tibia on car door last night, awakened with bleeding Pt is on xarelto.

## 2013-12-29 NOTE — ED Provider Notes (Signed)
CSN: [id number]     Arrival date & time 12/28/13  1152 History   First MD Initiated Contact with Patient 12/28/13 1211     Chief Complaint  Patient presents with  . Leg Injury     (Consider location/radiation/quality/duration/timing/severity/associated sxs/prior Treatment) The history is provided by the patient.    RAYMERE FRAN IV is a 68 y.o. male presenting with a bleeding wound to his right anterior lower leg.  He struck it on a door last night causing a small hematoma without bleeding.  However, when he woke today, it had bled while sleeping and continues to bleed despite multiple attempts at applying pressure.  He is taking xarelto for atrial fibrillation. He denies any other bleeding problems and denies any significant pain at the injury site.  His tetanus is out of date.     Past Medical History  Diagnosis Date  . Anemia     At age 66  . Shortness of breath   . Hypertension   . CHF (congestive heart failure)   . Arthritis   . PAF (paroxysmal atrial fibrillation)   . Cardiomyopathy     tachycardia mediated  . Cocaine abuse in remission   . Alcohol abuse, in remission   . Dyslipidemia    Past Surgical History  Procedure Laterality Date  . Hemorroidectomy  [id number]    At [facility]  . Colonoscopy      Patient had done 25 years ago Dr.Rehman @ [facility]  . Mastoidectomy      Patient had this surgery at age 73  . Upper gastrointestinal endoscopy      This was done 25 years ago at the time of TCS  . Wisdom tooth extraction    . Tee without cardioversion N/A 11/17/2012    Procedure: TRANSESOPHAGEAL ECHOCARDIOGRAM (TEE);  Surgeon: Thurmon Fair, MD;  Location: [facility] ENDOSCOPY;  Service: Cardiovascular;  Laterality: N/A;  . Cardioversion N/A 11/17/2012    Procedure: CARDIOVERSION;  Surgeon: Thurmon Fair, MD;  Location: [facility] ENDOSCOPY;  Service: Cardiovascular;  Laterality: N/A;  . Tee with cardioversion  03/2010  . US echocardiography  09/19/2009    patent foramen ovale suspected,trace  MR,mild TR   Family History  Problem Relation Age of Onset  . Pancreatic cancer Mother   . Healthy Sister   . Healthy Brother   . Healthy Sister   . Healthy Brother    History  Substance Use Topics  . Smoking status: Former Smoker    Types: Cigarettes    Quit date: 09/16/2007  . Smokeless tobacco: Never Used  . Alcohol Use: No     Comment: ETOH-last use 15 years ago    Review of Systems  Constitutional: Negative for fever and chills.  Respiratory: Negative for shortness of breath and wheezing.   Skin: Positive for wound.  Neurological: Negative for weakness and numbness.      Allergies  Lisinopril  Home Medications   Prior to Admission medications   Medication Sig Start Date End Date Taking? Authorizing Provider  albuterol (PROVENTIL HFA;VENTOLIN HFA) 108 (90 BASE) MCG/ACT inhaler Inhale 2 puffs into the lungs every 6 (six) hours as needed for wheezing.   Yes Historical Provider, MD  Coenzyme Q10 (COQ10 PO) Take 1 tablet by mouth daily.   Yes Historical Provider, MD  Fluticasone Furoate-Vilanterol (BREO ELLIPTA) 100-25 MCG/INH AEPB Inhale 2 puffs into the lungs daily.   Yes Historical Provider, MD  furosemide (LASIX) 40 MG tablet Take 40 mg by mouth daily.   Yes  Historical Provider, MD  Probiotic Product (ACIDOPHILUS PEARLS PO) Take 1 capsule by mouth daily.    Yes Historical Provider, MD  Rivaroxaban (XARELTO) 20 MG TABS tablet Take 1 tablet (20 mg total) by mouth daily with supper. 06/18/13  Yes Mihai Croitoru, MD  sotalol (BETAPACE) 80 MG tablet Take 80 mg by mouth 2 (two) times daily.   Yes Historical Provider, MD  spironolactone (ALDACTONE) 50 MG tablet Take 50 mg by mouth 2 (two) times daily.    Yes Historical Provider, MD  thiamine (VITAMIN B-1) 100 MG tablet Take 100 mg by mouth daily.   Yes Historical Provider, MD  tiotropium (SPIRIVA) 18 MCG inhalation capsule Place 18 mcg into inhaler and inhale daily.   Yes Historical Provider, MD  vitamin A [id number] UNIT  capsule Take 10,000 Units by mouth daily.   Yes Historical Provider, MD  vitamin C (ASCORBIC ACID) 500 MG tablet Take 500 mg by mouth daily.   Yes Historical Provider, MD   BP 154/79  Pulse 80  Temp(Src) 97.7 F (36.5 C) (Oral)  Resp 18  Ht 6\' 2"  (1.88 m)  Wt 180 lb (81.647 kg)  BMI 23.10 kg/m2  SpO2 94% Physical Exam  Constitutional: He is oriented to person, place, and time. He appears well-developed and well-nourished.  HENT:  Head: Normocephalic.  Cardiovascular: Normal rate.   Pulmonary/Chest: Effort normal.  Musculoskeletal: He exhibits no tenderness.  Neurological: He is alert and oriented to person, place, and time. No sensory deficit.  Skin: Bruising and laceration noted.  Approximate 3 cm area of ecchymosis right mid anterior tibia with 0.2 cm ulceration/puncture in the center which has a persistent small venous bleed.      ED Course  Procedures (including critical care time)   LACERATION REPAIR Performed by: Burgess AmorIDOL, JULIE Authorized by: Burgess AmorIDOL, JULIE Consent: Verbal consent obtained. Risks and benefits: risks, benefits and alternatives were discussed Consent given by: patient Patient identity confirmed: provided demographic data Prepped and Draped in normal sterile fashion Wound explored  Laceration Location: right anterior tibia Laceration Length: 0.2 cm  No Foreign Bodies seen or palpated  Anesthesia: local infiltration  Local anesthetic: lidocaine 1% with epinephrine  Anesthetic total: 2 ml  Irrigation method: syringe Amount of cleaning: standard with betadine, then NS  Skin closure: ethilon 4-0  Number of sutures: 2 in crossed fashion to provide purse string-like closure.  Hemostasis was obtained.  Technique:   Patient tolerance: Patient tolerated the procedure well with no immediate complications.  Labs Review Labs Reviewed - No data to display  Imaging Review No results found.   EKG Interpretation None      MDM   Final diagnoses:   Laceration    Wound care instructions given.  Pt advised to have sutures removed in 7 days,  Return here sooner for any signs of infection including redness, swelling, worse pain or drainage of pus.  The patient appears reasonably screened and/or stabilized for discharge and I doubt any other medical condition or other [facility] requiring further screening, evaluation, or treatment in the ED at this time prior to discharge.        Burgess AmorJulie Idol, PA-C 12/29/13 2224

## 2013-12-30 NOTE — ED Provider Notes (Signed)
Medical screening examination/treatment/procedure(s) were performed by non-physician practitioner and as supervising physician I was immediately available for consultation/collaboration.   EKG Interpretation None        Joseph L Zammit, MD 12/30/13 1017 

## 2014-03-22 ENCOUNTER — Other Ambulatory Visit: Payer: Self-pay | Admitting: *Deleted

## 2014-03-22 MED ORDER — RIVAROXABAN 20 MG PO TABS: 20.0000 mg | ORAL_TABLET | Freq: Every day | ORAL | Status: DC

## 2014-04-13 ENCOUNTER — Ambulatory Visit: Payer: Medicare Other | Admitting: Cardiovascular Disease

## 2014-05-11 ENCOUNTER — Ambulatory Visit: Payer: Medicare Other | Admitting: Cardiovascular Disease

## 2014-06-10 ENCOUNTER — Encounter: Payer: Self-pay | Admitting: Cardiovascular Disease

## 2014-06-10 ENCOUNTER — Ambulatory Visit (INDEPENDENT_AMBULATORY_CARE_PROVIDER_SITE_OTHER): Payer: Medicare Other | Admitting: Cardiovascular Disease

## 2014-06-10 VITALS — BP 170/92 | HR 74 | Ht 74.0 in | Wt 154.2 lb

## 2014-06-10 DIAGNOSIS — I5022 Chronic systolic (congestive) heart failure: Secondary | ICD-10-CM

## 2014-06-10 DIAGNOSIS — I429 Cardiomyopathy, unspecified: Secondary | ICD-10-CM

## 2014-06-10 DIAGNOSIS — R9431 Abnormal electrocardiogram [ECG] [EKG]: Secondary | ICD-10-CM

## 2014-06-10 DIAGNOSIS — I4581 Long QT syndrome: Secondary | ICD-10-CM

## 2014-06-10 DIAGNOSIS — R000 Tachycardia, unspecified: Secondary | ICD-10-CM

## 2014-06-10 DIAGNOSIS — I4819 Other persistent atrial fibrillation: Secondary | ICD-10-CM

## 2014-06-10 DIAGNOSIS — I43 Cardiomyopathy in diseases classified elsewhere: Secondary | ICD-10-CM

## 2014-06-10 DIAGNOSIS — I428 Other cardiomyopathies: Secondary | ICD-10-CM

## 2014-06-10 DIAGNOSIS — I481 Persistent atrial fibrillation: Secondary | ICD-10-CM

## 2014-06-10 DIAGNOSIS — I10 Essential (primary) hypertension: Secondary | ICD-10-CM

## 2014-06-10 MED ORDER — RIVAROXABAN 20 MG PO TABS: 20.0000 mg | ORAL_TABLET | Freq: Every day | ORAL | Status: AC

## 2014-06-10 NOTE — Patient Instructions (Signed)
Your physician wants you to follow-up in: 1 year with Dr Royann Shivers. You will receive a reminder letter in the mail two months in advance. If you don't receive a letter, please call our office to schedule the follow-up appointment.   Please monitor your blood pressure and call in in 2 weeks with the readings.  [phone]  How to Take Your Blood Pressure HOW DO I GET A BLOOD PRESSURE MACHINE?  You can buy an electronic home blood pressure machine at your local pharmacy. Insurance will sometimes cover the cost if you have a prescription.  Ask your doctor what type of machine is best for you. There are different machines for your arm and your wrist.  If you decide to buy a machine to check your blood pressure on your arm, first check the size of your arm so you can buy the right size cuff. To check the size of your arm:   Use a measuring tape that shows both inches and centimeters.   Wrap the measuring tape around the upper-middle part of your arm. You may need someone to help you measure.   Write down your arm measurement in both inches and centimeters.   To measure your blood pressure correctly, it is important to have the right size cuff.   If your arm is up to 13 inches (up to 34 centimeters), get an adult cuff size.  If your arm is 13 to 17 inches (35 to 44 centimeters), get a large adult cuff size.    If your arm is 17 to 20 inches (45 to 52 centimeters), get an adult thigh cuff.  WHAT DO THE NUMBERS MEAN?   There are two numbers that make up your blood pressure. For example: 120/80.  The first number (120 in our example) is called the "systolic pressure." It is a measure of the pressure in your blood vessels when your heart is pumping blood.  The second number (80 in our example) is called the "diastolic pressure." It is a measure of the pressure in your blood vessels when your heart is resting between beats.  Your doctor will tell you what your blood pressure should  be. WHAT SHOULD I DO BEFORE I CHECK MY BLOOD PRESSURE?   Try to rest or relax for at least 30 minutes before you check your blood pressure.  Do not smoke.  Do not have any drinks with caffeine, such as:  Soda.  Coffee.  Tea.  Check your blood pressure in a quiet room.  Sit down and stretch out your arm on a table. Keep your arm at about the level of your heart. Let your arm relax.  Make sure that your legs are not crossed. HOW DO I CHECK MY BLOOD PRESSURE?  Follow the directions that came with your machine.  Make sure you remove any tight-fighting clothing from your arm or wrist. Wrap the cuff around your upper arm or wrist. You should be able to fit a finger between the cuff and your arm. If you cannot fit a finger between the cuff and your arm, it is too tight and should be removed and rewrapped.  Some units require you to manually pump up the arm cuff.  Automatic units inflate the cuff when you press a button.  Cuff deflation is automatic in both models.  After the cuff is inflated, the unit measures your blood pressure and pulse. The readings are shown on a monitor. Hold still and breathe normally while the cuff is inflated.  Getting a reading takes less than a minute.  Some models store readings in a memory. Some provide a printout of readings. If your machine does not store your readings, keep a written record.  Take readings with you to your next visit with your doctor. Document Released: 06/06/2008 Document Revised: 11/08/2013 Document Reviewed: 08/19/2013 [facility] Patient Information 2015 [employer]. This information is not intended to replace advice given to you by your health care provider. Make sure you discuss any questions you have with your health care provider.

## 2014-06-12 NOTE — Progress Notes (Signed)
Patient ID: Ronald Owens, male   DOB: 04-15-1946, 68 y.o.   MRN: [id number]      Reason for office visit Atrial fibrillation, cardiomyopathy  Ronald Owens feels great. He enjoyed his trips to [place] and [place] and just returned from a conference for substance abuse counselors. He has baseline dyspnea, functional class 2, presumed secondary to COPD. He had AF with RVR and low LVEF (35%, presumed secondary to tachycardia induced cardiomyopathy) and a successful cardioversion in May 2015. We had planned a repeat echo after the last cardioversion but this has not been performed.  He has mild ankle swelling since he skipped a day of diuretic for travel.  Left ventricular ejection fraction was 35-40% at the time of his transesophageal echocardiogram. Note ejection fraction of 20-25% when he presented with atrial flutter in 2008, improvement to 55% by the time he had followup echocardiogram in 2011. Suspicion is that he also had tachycardia and is cardiopathy at that time. Normal coronary angiography in 2008.  Allergies  Allergen Reactions  . Lisinopril Cough    Current Outpatient Prescriptions  Medication Sig Dispense Refill  . Acetylcysteine (N-ACETYL-L-CYSTEINE) 600 MG CAPS Take 600 mg by mouth daily.    Marland Kitchen albuterol (PROVENTIL HFA;VENTOLIN HFA) 108 (90 BASE) MCG/ACT inhaler Inhale 2 puffs into the lungs every 6 (six) hours as needed for wheezing.    . Coenzyme Q10 (COQ10 PO) Take 1 tablet by mouth daily.    . Fluticasone Furoate-Vilanterol (BREO ELLIPTA) 100-25 MCG/INH AEPB Inhale 2 puffs into the lungs daily.    . furosemide (LASIX) 40 MG tablet Take 40 mg by mouth daily.    . niacin 500 MG tablet Take 500 mg by mouth 3 (three) times daily.    . Omega-3 Fatty Acids (FISH OIL PO) Take by mouth.    . Probiotic Product (ACIDOPHILUS PEARLS PO) Take 1 capsule by mouth daily.     . rivaroxaban (XARELTO) 20 MG TABS tablet Take 1 tablet (20 mg total) by mouth daily with supper. 30 tablet 6  .  sotalol (BETAPACE) 120 MG tablet   1  . sotalol (BETAPACE) 80 MG tablet Take 80 mg by mouth 2 (two) times daily.    Marland Kitchen SPIRIVA RESPIMAT 2.5 MCG/ACT AERS   1  . spironolactone (ALDACTONE) 50 MG tablet Take 50 mg by mouth 2 (two) times daily.     Marland Kitchen tiotropium (SPIRIVA) 18 MCG inhalation capsule Place 18 mcg into inhaler and inhale daily.    . vitamin A 10000 UNIT capsule Take 10,000 Units by mouth daily.    . vitamin C (ASCORBIC ACID) 500 MG tablet Take 500 mg by mouth daily.     No current facility-administered medications for this visit.    Past Medical History  Diagnosis Date  . Anemia     At age 44  . Shortness of breath   . Hypertension   . CHF (congestive heart failure)   . Arthritis   . PAF (paroxysmal atrial fibrillation)   . Cardiomyopathy     tachycardia mediated  . Cocaine abuse in remission   . Alcohol abuse, in remission   . Dyslipidemia     Past Surgical History  Procedure Laterality Date  . Hemorroidectomy  [id number]    At [facility]  . Colonoscopy      Patient had done 25 years ago Dr.Rehman @ [facility]  . Mastoidectomy      Patient had this surgery at age 53  . Upper gastrointestinal endoscopy  This was done 25 years ago at the time of TCS  . Wisdom tooth extraction    . Tee without cardioversion N/A 11/17/2012    Procedure: TRANSESOPHAGEAL ECHOCARDIOGRAM (TEE);  Surgeon: Sanda Klein, MD;  Location: [place];  Service: Cardiovascular;  Laterality: N/A;  . Cardioversion N/A 11/17/2012    Procedure: CARDIOVERSION;  Surgeon: Sanda Klein, MD;  Location: [facility] ENDOSCOPY;  Service: Cardiovascular;  Laterality: N/A;  . Tee with cardioversion  03/2010  . US echocardiography  09/19/2009    patent foramen ovale suspected,trace MR,mild TR    Family History  Problem Relation Age of Onset  . Pancreatic cancer Mother   . Healthy Sister   . Healthy Brother   . Healthy Sister   . Healthy Brother     History   Social History  . Marital Status: Divorced    Spouse  Name: N/A    Number of Children: N/A  . Years of Education: N/A   Occupational History  . Not on file.   Social History Main Topics  . Smoking status: Former Smoker    Types: Cigarettes    Quit date: 09/16/2007  . Smokeless tobacco: Never Used  . Alcohol Use: No     Comment: ETOH-last use 15 years ago  . Drug Use: No     Comment: Cocaine-last use 15 years ago  . Sexual Activity: Not on file   Other Topics Concern  . Not on file   Social History Narrative    Review of systems: Mild exertional dyspnea. Ankle edema if he skips diuretic. The patient specifically denies any chest pain at rest or with exertion, dyspnea at rest or with exertion, orthopnea, paroxysmal nocturnal dyspnea, syncope, palpitations, focal neurological deficits, intermittent claudication, lower extremity edema, unexplained weight gain, cough, hemoptysis or wheezing.  The patient also denies abdominal pain, nausea, vomiting, dysphagia, diarrhea, constipation, polyuria, polydipsia, dysuria, hematuria, frequency, urgency, abnormal bleeding or bruising, fever, chills, unexpected weight changes, mood swings, change in skin or hair texture, change in voice quality, auditory or visual problems, allergic reactions or rashes, new musculoskeletal complaints other than usual "aches and pains".   PHYSICAL EXAM BP 170/92 mmHg  Pulse 74  Ht $R'6\' 2"'aJ$  (1.88 m)  Wt 154 lb 3.2 oz (69.945 kg)  BMI 19.79 kg/m2 Recheck 168/86 mm Hg General: Alert, oriented x3, no distress Head: no evidence of trauma, PERRL, EOMI, no exophtalmos or lid lag, no myxedema, no xanthelasma; normal ears, nose and oropharynx Neck: 4-5 cm jugular venous pulsations and no hepatojugular reflux; brisk carotid pulses without delay and no carotid bruits Chest: emphysematous chest, reduced breath sounds throughout, but clear to auscultation, no signs of consolidation by percussion or palpation, normal fremitus, symmetrical and full respiratory  excursions Cardiovascular: normal position and quality of the apical impulse, regular rhythm, normal first and second heart sounds, no murmurs, rubs or gallops Abdomen: no tenderness or distention, no masses by palpation, no abnormal pulsatility or arterial bruits, normal bowel sounds, no hepatosplenomegaly Extremities: no clubbing, cyanosis; 1+ symmetrical ankle edema; 2+ radial, ulnar and brachial pulses bilaterally; 2+ right femoral, posterior tibial and dorsalis pedis pulses; 2+ left femoral, posterior tibial and dorsalis pedis pulses; no subclavian or femoral bruits Neurological: grossly nonfocal   EKG: NSR, chronic inferior ST-T changes, QTc 468 ms.  Lipid Panel     Component Value Date/Time   CHOL  12/23/2006 0400    172        ATP III CLASSIFICATION:  <200     mg/dL  Desirable  200-239  mg/dL   Borderline High  >=240    mg/dL   High   TRIG 86 12/23/2006 0400   HDL 41 12/23/2006 0400   CHOLHDL 4.2 12/23/2006 0400   VLDL 17 12/23/2006 0400   LDLCALC * 12/23/2006 0400    114        Total Cholesterol/HDL:CHD Risk Coronary Heart Disease Risk Table                     Men   Women  1/2 Average Risk   3.4   3.3    BMET    Component Value Date/Time   NA 143 10/06/2012 1415   K 4.4 10/06/2012 1415   CL 96 10/06/2012 1415   CO2 40* 10/06/2012 1415   GLUCOSE 96 10/06/2012 1415   BUN 15 10/06/2012 1415   CREATININE 1.06 10/06/2012 1415   CREATININE 1.12 12/26/2006 0310   CALCIUM 9.6 10/06/2012 1415   GFRNONAA >60 12/26/2006 0310   GFRAA  12/26/2006 0310    >60        The eGFR has been calculated using the MDRD equation. This calculation has not been validated in all clinical     ASSESSMENT AND PLAN Chronic systolic CHF (congestive heart failure) He appears to be euvolemic clinically, now NYHA functional class II. The progressive improvement that he had following the cardioversion is probably related to resolution of tachycardia induced cardiomyopathy. Reasonable to  suspect, although would have preferred to document the improvement in EF. He had slow progressive continuing improvement after the immediate improvement after cardioversion. I believe the 2 staged improvement was related to resolution of the arrhythmia itself followed by a resolution of associated cardiomyopathy.  Persistent atrial fibrillation He had immediate improvement in symptoms following the cardioversion and resolution of the atrial fibrillation. He is on appropriate anticoagulant therapy. He has not had any bleeding complications. His most recent arrhythmia appears to have been typical counterclockwise right atrial flutter, but there is documentation of atrial fibrillation in the past. Either way if he has frequent recurrence of atrial tachyarrhythmia I believe he would be a good candidate for radiofrequency ablation.  Tachycardia induced cardiomyopathy Left ventricular ejection fraction was 35-40% at the time of his transesophageal echocardiogram. Note ejection fraction of 20-25% when he presented with atrial flutter in 2008, improvement to 55% by the time he had followup echocardiogram in 2011. Suspicion is that he also had tachycardia and is cardiopathy at that time. Normal coronary angiography in 2008.  QT prolongation The QT interval is in the expected therapeutic range for treatment with sotalol. This should be monitored at least once every 6 months. We discussed the importance of avoiding QT prolonging drugs. He should ask the prescribing physician and his pharmacist about a potential of sotalol interaction whenever he gets a new medication. Potassium levels she also be monitored at least twice yearly, more often if his diuretic prescription is changed.  COPD (chronic obstructive pulmonary disease)  Elevated BP Unusually high today. He has monitored it at home and it is much better.  Orders Placed This Encounter  Procedures  . EKG 12-Lead   Meds ordered this encounter   Medications  . sotalol (BETAPACE) 120 MG tablet    Sig:     Refill:  1  . SPIRIVA RESPIMAT 2.5 MCG/ACT AERS    Sig:     Refill:  1  . niacin 500 MG tablet    Sig: Take 500 mg by mouth 3 (  three) times daily.  . Omega-3 Fatty Acids (FISH OIL PO)    Sig: Take by mouth.  . Acetylcysteine (N-ACETYL-L-CYSTEINE) 600 MG CAPS    Sig: Take 600 mg by mouth daily.  . rivaroxaban (XARELTO) 20 MG TABS tablet    Sig: Take 1 tablet (20 mg total) by mouth daily with supper.    Dispense:  30 tablet    Refill:  6    Need appointment before further refills    Estel Scholze  Sanda Klein, MD, [facility] [phone] office [phone] pager

## 2015-04-08 DEATH — deceased
